# Patient Record
Sex: Male | Born: 1960
Health system: Southern US, Community
[De-identification: ages and names within clinical notes are randomized; demographics above are authoritative.]

## PROBLEM LIST (undated history)

## (undated) DIAGNOSIS — L570 Actinic keratosis: Secondary | ICD-10-CM

## (undated) HISTORY — PX: APPENDECTOMY: SHX54

## (undated) HISTORY — PX: REPLACEMENT TOTAL KNEE: SUR1224

## (undated) HISTORY — PX: ANTERIOR CRUCIATE LIGAMENT REPAIR: SHX115

## (undated) HISTORY — DX: Actinic keratosis: L57.0

## (undated) HISTORY — PX: KNEE SURGERY: SHX244

## (undated) HISTORY — PX: TOTAL HIP ARTHROPLASTY: SHX124

---

## 2006-10-17 DIAGNOSIS — M129 Arthropathy, unspecified: Secondary | ICD-10-CM | POA: Insufficient documentation

## 2007-04-29 ENCOUNTER — Ambulatory Visit: Payer: Self-pay | Admitting: Family Medicine

## 2007-09-11 ENCOUNTER — Ambulatory Visit: Payer: Self-pay

## 2010-01-09 ENCOUNTER — Ambulatory Visit: Payer: Self-pay | Admitting: Unknown Physician Specialty

## 2011-10-17 DIAGNOSIS — Z9889 Other specified postprocedural states: Secondary | ICD-10-CM | POA: Insufficient documentation

## 2013-01-08 ENCOUNTER — Ambulatory Visit: Payer: Self-pay | Admitting: Unknown Physician Specialty

## 2013-01-08 LAB — HM COLONOSCOPY

## 2013-01-12 LAB — PATHOLOGY REPORT

## 2013-12-02 LAB — HEPATIC FUNCTION PANEL
ALK PHOS: 90 U/L (ref 25–125)
ALT: 12 U/L (ref 10–40)
AST: 12 U/L — AB (ref 14–40)
Bilirubin, Total: 1.2 mg/dL

## 2013-12-02 LAB — CBC AND DIFFERENTIAL
HCT: 47 % (ref 41–53)
Hemoglobin: 16.4 g/dL (ref 13.5–17.5)
NEUTROS ABS: 1 /uL
Platelets: 258 10*3/uL (ref 150–399)
WBC: 8.1 10^3/mL

## 2013-12-02 LAB — BASIC METABOLIC PANEL
BUN: 15 mg/dL (ref 4–21)
Creatinine: 1.3 mg/dL (ref 0.6–1.3)
GLUCOSE: 100 mg/dL
Potassium: 4.7 mmol/L (ref 3.4–5.3)
SODIUM: 143 mmol/L (ref 137–147)

## 2013-12-02 LAB — LIPID PANEL
Cholesterol: 184 mg/dL (ref 0–200)
HDL: 32 mg/dL — AB (ref 35–70)
LDL Cholesterol: 108 mg/dL
LDl/HDL Ratio: 3.4
Triglycerides: 220 mg/dL — AB (ref 40–160)

## 2013-12-02 LAB — PSA: PSA: 3.4

## 2013-12-02 LAB — TSH: TSH: 2.09 u[IU]/mL (ref 0.41–5.90)

## 2014-12-06 DIAGNOSIS — M1990 Unspecified osteoarthritis, unspecified site: Secondary | ICD-10-CM | POA: Insufficient documentation

## 2014-12-06 DIAGNOSIS — E7800 Pure hypercholesterolemia, unspecified: Secondary | ICD-10-CM | POA: Insufficient documentation

## 2014-12-08 ENCOUNTER — Ambulatory Visit (INDEPENDENT_AMBULATORY_CARE_PROVIDER_SITE_OTHER): Payer: BLUE CROSS/BLUE SHIELD | Admitting: Family Medicine

## 2014-12-08 ENCOUNTER — Encounter: Payer: Self-pay | Admitting: Family Medicine

## 2014-12-08 VITALS — BP 122/78 | HR 72 | Temp 98.4°F | Resp 16 | Ht 73.0 in | Wt 233.0 lb

## 2014-12-08 DIAGNOSIS — Z0000 Encounter for general adult medical examination without abnormal findings: Secondary | ICD-10-CM

## 2014-12-08 DIAGNOSIS — Z125 Encounter for screening for malignant neoplasm of prostate: Secondary | ICD-10-CM

## 2014-12-08 DIAGNOSIS — D229 Melanocytic nevi, unspecified: Secondary | ICD-10-CM

## 2014-12-08 DIAGNOSIS — Z1211 Encounter for screening for malignant neoplasm of colon: Secondary | ICD-10-CM

## 2014-12-08 LAB — POCT URINALYSIS DIPSTICK
BILIRUBIN UA: NEGATIVE
GLUCOSE UA: NEGATIVE
Ketones, UA: NEGATIVE
Leukocytes, UA: NEGATIVE
Nitrite, UA: NEGATIVE
Protein, UA: NEGATIVE
RBC UA: NEGATIVE
Urobilinogen, UA: NEGATIVE
pH, UA: 6.5

## 2014-12-08 LAB — IFOBT (OCCULT BLOOD): IFOBT: NEGATIVE

## 2014-12-08 NOTE — Progress Notes (Signed)
Patient ID: Brandon Salazar, male   DOB: December 25, 1960, 54 y.o.   MRN: [id number]       Patient: Brandon Salazar, Male    DOB: [birth year], 54 y.o.   MRN: [id number] Visit Date: 12/08/2014  Today's Provider: [name], MD   Chief Complaint  Patient presents with  . Annual Exam   Subjective:    Annual physical exam Brandon Salazar is a 54 y.o. male who presents today for health maintenance and complete physical. He feels fairly well. He reports exercising walking about 1.5 miles a day. He reports he is sleeping well. Patient is married father of 4. 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 Marriage is good, all children are doing well, he likes his job. ----------------------------------------------------------------- Colonoscopy- 01/08/13 Flu vaccine 09/2014   Review of Systems  Constitutional: Negative.   HENT: Negative.   Eyes: Negative.   Respiratory: Negative.   Cardiovascular: Negative.   Gastrointestinal: Negative.   Endocrine: Negative.   Genitourinary: Negative.   Musculoskeletal: Negative.   Skin: Negative.   Allergic/Immunologic: Negative.   Neurological: Negative.   Hematological: Negative.   Psychiatric/Behavioral: Negative.     Social History He  reports that he has never smoked. He does not have any smokeless tobacco history on file. He reports that he does not drink alcohol or use illicit drugs. Social History   Social History  . Marital Status: Married    Spouse Name: N/A  . Number of Children: N/A  . Years of Education: N/A   Social History Main Topics  . Smoking status: Never Smoker   . Smokeless tobacco: None  . Alcohol Use: No  . Drug Use: No  . Sexual Activity: Not Asked   Other Topics Concern  . None   Social History Narrative    Patient Active Problem List   Diagnosis Date Noted  . Hypercholesteremia 12/06/2014  . Arthritis, degenerative 12/06/2014  . History of knee surgery 10/17/2011  . History of repair of hip joint 10/17/2011   . Arthropathia 10/17/2006    Past Surgical History  Procedure Laterality Date  . Appendectomy    . Anterior cruciate ligament repair      Family History  Family Status  Relation Status Death Age  . Mother Alive   . Father Alive   . Sister Alive   . Brother Alive   . Sister Alive   . Sister Alive    His family history includes Arthritis in his father; Heart disease in his maternal grandfather; Stroke in his mother.    Allergies  Allergen Reactions  . Penicillins     Previous Medications   No medications on file    Patient Care Team: Jerrol Banana., MD as PCP - General (Family Medicine)     Objective:   Vitals: BP 122/78 mmHg  Pulse 72  Temp(Src) 98.4 F (36.9 C) (Oral)  Resp 16  Ht 6\' 1"  (1.854 m)  Wt 233 lb (105.688 kg)  BMI 30.75 kg/m2   Physical Exam  Constitutional: He is oriented to person, place, and time. He appears well-developed and well-nourished.  HENT:  Head: Normocephalic and atraumatic.  Right Ear: External ear normal.  Left Ear: External ear normal.  Nose: Nose normal.  Mouth/Throat: Oropharynx is clear and moist.  Eyes: Conjunctivae and EOM are normal. Pupils are equal, round, and reactive to light.  Neck: Normal range of motion. Neck supple.  Cardiovascular: Normal rate, regular rhythm, normal heart sounds and intact distal pulses.  Pulmonary/Chest: Effort normal and breath sounds normal.  Abdominal: Soft. Bowel sounds are normal.  Genitourinary: Rectum normal, prostate normal and penis normal.  Musculoskeletal: Normal range of motion.  Neurological: He is alert and oriented to person, place, and time. He has normal reflexes.  Skin: Skin is warm and dry.  Psychiatric: He has a normal mood and affect. His behavior is normal. Judgment and thought content normal.     Depression Screen PHQ 2/9 Scores 12/08/2014  PHQ - 2 Score 0      Assessment & Plan:     Routine Health Maintenance and Physical Exam  Exercise  Activities and Dietary recommendations Goals    None      Immunization History  Administered Date(s) Administered  . Tdap 10/03/2005    Health Maintenance  Topic Date Due  . Hepatitis C Screening  1960/05/10  . HIV Screening  04/02/1973  . COLONOSCOPY  04/02/2008  . INFLUENZA VACCINE  12/08/2015 (Originally 08/23/2014)  . TETANUS/TDAP  10/04/2015      Discussed health benefits of physical activity, and encouraged him to engage in regular exercise appropriate for his age and condition.    --------------------------------------------------------------------

## 2014-12-09 LAB — CBC WITH DIFFERENTIAL/PLATELET
BASOS ABS: 0 10*3/uL (ref 0.0–0.2)
Basos: 1 %
EOS (ABSOLUTE): 0.1 10*3/uL (ref 0.0–0.4)
EOS: 2 %
Hematocrit: 44.1 % (ref 37.5–51.0)
Hemoglobin: 15.8 g/dL (ref 12.6–17.7)
Immature Grans (Abs): 0 10*3/uL (ref 0.0–0.1)
Immature Granulocytes: 0 %
LYMPHS ABS: 1.7 10*3/uL (ref 0.7–3.1)
Lymphs: 28 %
MCH: 30.8 pg (ref 26.6–33.0)
MCHC: 35.8 g/dL — AB (ref 31.5–35.7)
MCV: 86 fL (ref 79–97)
MONOS ABS: 0.5 10*3/uL (ref 0.1–0.9)
Monocytes: 9 %
Neutrophils Absolute: 3.6 10*3/uL (ref 1.4–7.0)
Neutrophils: 60 %
PLATELETS: 235 10*3/uL (ref 150–379)
RBC: 5.13 x10E6/uL (ref 4.14–5.80)
RDW: 13.8 % (ref 12.3–15.4)
WBC: 5.9 10*3/uL (ref 3.4–10.8)

## 2014-12-09 LAB — COMPREHENSIVE METABOLIC PANEL
ALBUMIN: 4.4 g/dL (ref 3.5–5.5)
ALT: 15 IU/L (ref 0–44)
AST: 16 IU/L (ref 0–40)
Albumin/Globulin Ratio: 2 (ref 1.1–2.5)
Alkaline Phosphatase: 87 IU/L (ref 39–117)
BUN / CREAT RATIO: 10 (ref 9–20)
BUN: 12 mg/dL (ref 6–24)
Bilirubin Total: 1.1 mg/dL (ref 0.0–1.2)
CALCIUM: 9.2 mg/dL (ref 8.7–10.2)
CO2: 26 mmol/L (ref 18–29)
CREATININE: 1.18 mg/dL (ref 0.76–1.27)
Chloride: 101 mmol/L (ref 97–106)
GFR, EST AFRICAN AMERICAN: 79 mL/min/{1.73_m2} (ref >59)
GFR, EST NON AFRICAN AMERICAN: 69 mL/min/{1.73_m2} (ref >59)
GLOBULIN, TOTAL: 2.2 g/dL (ref 1.5–4.5)
GLUCOSE: 95 mg/dL (ref 65–99)
Potassium: 4.3 mmol/L (ref 3.5–5.2)
SODIUM: 143 mmol/L (ref 136–144)
TOTAL PROTEIN: 6.6 g/dL (ref 6.0–8.5)

## 2014-12-09 LAB — LIPID PANEL WITH LDL/HDL RATIO
CHOLESTEROL TOTAL: 169 mg/dL (ref 100–199)
HDL: 28 mg/dL — ABNORMAL LOW (ref >39)
LDL Calculated: 75 mg/dL (ref 0–99)
LDl/HDL Ratio: 2.7 ratio units (ref 0.0–3.6)
Triglycerides: 328 mg/dL — ABNORMAL HIGH (ref 0–149)
VLDL Cholesterol Cal: 66 mg/dL — ABNORMAL HIGH (ref 5–40)

## 2014-12-09 LAB — TSH: TSH: 2.12 u[IU]/mL (ref 0.450–4.500)

## 2014-12-09 LAB — PSA: Prostate Specific Ag, Serum: 3.4 ng/mL (ref 0.0–4.0)

## 2014-12-09 LAB — COMMENT

## 2014-12-14 NOTE — Progress Notes (Signed)
Advised  ED 

## 2015-06-29 DIAGNOSIS — Z96642 Presence of left artificial hip joint: Secondary | ICD-10-CM | POA: Diagnosis not present

## 2015-06-29 DIAGNOSIS — Z09 Encounter for follow-up examination after completed treatment for conditions other than malignant neoplasm: Secondary | ICD-10-CM | POA: Diagnosis not present

## 2015-06-29 DIAGNOSIS — Z96641 Presence of right artificial hip joint: Secondary | ICD-10-CM | POA: Diagnosis not present

## 2015-06-29 DIAGNOSIS — Z96659 Presence of unspecified artificial knee joint: Secondary | ICD-10-CM | POA: Diagnosis not present

## 2015-12-21 ENCOUNTER — Encounter: Payer: Self-pay | Admitting: Family Medicine

## 2015-12-21 ENCOUNTER — Ambulatory Visit (INDEPENDENT_AMBULATORY_CARE_PROVIDER_SITE_OTHER): Payer: BLUE CROSS/BLUE SHIELD | Admitting: Family Medicine

## 2015-12-21 VITALS — BP 116/82 | HR 64 | Temp 98.3°F | Resp 14 | Ht 73.5 in | Wt 236.0 lb

## 2015-12-21 DIAGNOSIS — E7800 Pure hypercholesterolemia, unspecified: Secondary | ICD-10-CM

## 2015-12-21 DIAGNOSIS — Z23 Encounter for immunization: Secondary | ICD-10-CM | POA: Diagnosis not present

## 2015-12-21 DIAGNOSIS — Z1211 Encounter for screening for malignant neoplasm of colon: Secondary | ICD-10-CM

## 2015-12-21 DIAGNOSIS — M1990 Unspecified osteoarthritis, unspecified site: Secondary | ICD-10-CM | POA: Diagnosis not present

## 2015-12-21 DIAGNOSIS — Z0000 Encounter for general adult medical examination without abnormal findings: Secondary | ICD-10-CM

## 2015-12-21 DIAGNOSIS — Z125 Encounter for screening for malignant neoplasm of prostate: Secondary | ICD-10-CM | POA: Diagnosis not present

## 2015-12-21 LAB — POCT URINALYSIS DIPSTICK
BILIRUBIN UA: NEGATIVE
GLUCOSE UA: NEGATIVE
Ketones, UA: NEGATIVE
Leukocytes, UA: NEGATIVE
NITRITE UA: NEGATIVE
Protein, UA: NEGATIVE
RBC UA: NEGATIVE
Urobilinogen, UA: 0.2
pH, UA: 6

## 2015-12-21 LAB — IFOBT (OCCULT BLOOD): IFOBT: NEGATIVE

## 2015-12-21 NOTE — Progress Notes (Signed)
Patient: Brandon Salazar, Male    DOB: [birth year], 55 y.o.   MRN: [id number] Visit Date: 12/21/2015  Today's Provider: [name], MD   Chief Complaint  Patient presents with  . Annual Exam   Subjective:    Annual physical exam Brandon Salazar is a 55 y.o. male who presents today for health maintenance and complete physical. He feels well. He reports exercising none. He reports he is sleeping fairly well.  ----------------------------------------------------------------- Colonoscopy: 01/08/2013- 1 polyp internal hemorrhoids Tetanus given today Flu: 10/04/2015  Review of Systems  Constitutional: Negative.   HENT: Negative.   Eyes: Negative.   Respiratory: Negative.   Cardiovascular: Negative.   Gastrointestinal: Negative.   Endocrine: Negative.   Genitourinary: Negative.   Musculoskeletal: Negative.   Skin: Negative.   Allergic/Immunologic: Negative.   Neurological: Negative.   Hematological: Negative.   Psychiatric/Behavioral: Negative.     Social History      He  reports that he has never smoked. He does not have any smokeless tobacco history on file. He reports that he does not drink alcohol or use drugs.       Social History   Social History  . Marital status: Married    Spouse name: N/A  . Number of children: N/A  . Years of education: N/A   Social History Main Topics  . Smoking status: Never Smoker  . Smokeless tobacco: None  . Alcohol use No  . Drug use: No  . Sexual activity: Not Asked   Other Topics Concern  . None   Social History Narrative  . None    No past medical history on file.   Patient Active Problem List   Diagnosis Date Noted  . Hypercholesteremia 12/06/2014  . Arthritis, degenerative 12/06/2014  . History of knee surgery 10/17/2011  . History of repair of hip joint 10/17/2011  . Arthropathia 10/17/2006    Past Surgical History:  Procedure Laterality Date  . ANTERIOR CRUCIATE LIGAMENT REPAIR    . APPENDECTOMY       Family History        Family Status  Relation Status  . Mother Alive  . Father Alive  . Sister Alive  . Brother Alive  . Sister Alive  . Sister Alive        His family history includes Arthritis in his father; Heart disease in his maternal grandfather; Stroke in his mother.     Allergies  Allergen Reactions  . Penicillins     No current outpatient prescriptions on file.   Patient Care Team: Jerrol Banana., MD as PCP - General (Family Medicine)      Objective:   Vitals: BP 116/82 (BP Location: Right Arm, Patient Position: Sitting, Cuff Size: Normal)   Pulse 64   Temp 98.3 F (36.8 C) (Oral)   Resp 14   Ht 6' 1.5" (1.867 m)   Wt 236 lb (107 kg)   BMI 30.71 kg/m    Physical Exam  Constitutional: He is oriented to person, place, and time. He appears well-developed and well-nourished.  HENT:  Head: Normocephalic and atraumatic.  Right Ear: External ear normal.  Left Ear: External ear normal.  Mouth/Throat: Oropharynx is clear and moist.  Eyes: Conjunctivae and EOM are normal. Pupils are equal, round, and reactive to light.  Neck: Normal range of motion. Neck supple.  Cardiovascular: Normal rate, regular rhythm, normal heart sounds and intact distal pulses.   Pulmonary/Chest: Effort normal and breath  sounds normal.  Abdominal: Soft. Bowel sounds are normal.  Genitourinary: Rectum normal, prostate normal and penis normal.  Musculoskeletal: Normal range of motion.  Neurological: He is alert and oriented to person, place, and time. He has normal reflexes.  Skin: Skin is warm and dry.  Psychiatric: He has a normal mood and affect. His behavior is normal. Judgment and thought content normal.     Depression Screen PHQ 2/9 Scores 12/08/2014  PHQ - 2 Score 0      Assessment & Plan:     Routine Health Maintenance and Physical Exam  Exercise Activities and Dietary recommendations Goals    None      Immunization History  Administered Date(s)  Administered  . Influenza Whole 10/04/2015  . Td 12/21/2015  . Tdap 10/03/2005    Health Maintenance  Topic Date Due  . Hepatitis C Screening  12-05-1960  . HIV Screening  04/02/1973  . COLONOSCOPY  01/09/2023  . TETANUS/TDAP  12/20/2025  . INFLUENZA VACCINE  Completed    Overall health is good. Patient plans to get back in regular routine which she is not been able to do for a year or 2. Discussed health benefits of physical activity, and encouraged him to engage in regular exercise appropriate for his age and condition.    --------------------------------------------------------------------  I have done the exam and reviewed the above chart and it is accurate to the best of my knowledge. Development worker, community has been used in this note in any air is in the dictation or transcription are unintentional.   [name], MD  [facility]

## 2015-12-22 DIAGNOSIS — Z125 Encounter for screening for malignant neoplasm of prostate: Secondary | ICD-10-CM | POA: Diagnosis not present

## 2015-12-22 DIAGNOSIS — Z0000 Encounter for general adult medical examination without abnormal findings: Secondary | ICD-10-CM | POA: Diagnosis not present

## 2015-12-22 DIAGNOSIS — M1990 Unspecified osteoarthritis, unspecified site: Secondary | ICD-10-CM | POA: Diagnosis not present

## 2015-12-22 DIAGNOSIS — E7800 Pure hypercholesterolemia, unspecified: Secondary | ICD-10-CM | POA: Diagnosis not present

## 2015-12-23 LAB — COMPREHENSIVE METABOLIC PANEL
ALBUMIN: 4.4 g/dL (ref 3.5–5.5)
ALK PHOS: 88 IU/L (ref 39–117)
ALT: 19 IU/L (ref 0–44)
AST: 18 IU/L (ref 0–40)
Albumin/Globulin Ratio: 1.8 (ref 1.2–2.2)
BILIRUBIN TOTAL: 1.1 mg/dL (ref 0.0–1.2)
BUN / CREAT RATIO: 13 (ref 9–20)
BUN: 17 mg/dL (ref 6–24)
CHLORIDE: 101 mmol/L (ref 96–106)
CO2: 23 mmol/L (ref 18–29)
Calcium: 9.4 mg/dL (ref 8.7–10.2)
Creatinine, Ser: 1.26 mg/dL (ref 0.76–1.27)
GFR calc Af Amer: 73 mL/min/{1.73_m2} (ref >59)
GFR calc non Af Amer: 63 mL/min/{1.73_m2} (ref >59)
GLUCOSE: 98 mg/dL (ref 65–99)
Globulin, Total: 2.4 g/dL (ref 1.5–4.5)
Potassium: 4 mmol/L (ref 3.5–5.2)
SODIUM: 143 mmol/L (ref 134–144)
Total Protein: 6.8 g/dL (ref 6.0–8.5)

## 2015-12-23 LAB — CBC WITH DIFFERENTIAL
BASOS ABS: 0.1 10*3/uL (ref 0.0–0.2)
Basos: 1 %
EOS (ABSOLUTE): 0.2 10*3/uL (ref 0.0–0.4)
EOS: 3 %
Hematocrit: 45.5 % (ref 37.5–51.0)
Hemoglobin: 16 g/dL (ref 12.6–17.7)
IMMATURE GRANULOCYTES: 0 %
Immature Grans (Abs): 0 10*3/uL (ref 0.0–0.1)
Lymphocytes Absolute: 1.4 10*3/uL (ref 0.7–3.1)
Lymphs: 18 %
MCH: 30.7 pg (ref 26.6–33.0)
MCHC: 35.2 g/dL (ref 31.5–35.7)
MCV: 87 fL (ref 79–97)
MONOS ABS: 0.6 10*3/uL (ref 0.1–0.9)
Monocytes: 8 %
NEUTROS PCT: 70 %
Neutrophils Absolute: 5.5 10*3/uL (ref 1.4–7.0)
RBC: 5.22 x10E6/uL (ref 4.14–5.80)
RDW: 13.6 % (ref 12.3–15.4)
WBC: 7.8 10*3/uL (ref 3.4–10.8)

## 2015-12-23 LAB — LIPID PANEL
CHOL/HDL RATIO: 5.8 ratio — AB (ref 0.0–5.0)
Cholesterol, Total: 192 mg/dL (ref 100–199)
HDL: 33 mg/dL — ABNORMAL LOW (ref >39)
LDL CALC: 87 mg/dL (ref 0–99)
Triglycerides: 362 mg/dL — ABNORMAL HIGH (ref 0–149)
VLDL Cholesterol Cal: 72 mg/dL — ABNORMAL HIGH (ref 5–40)

## 2015-12-23 LAB — TSH: TSH: 2.67 u[IU]/mL (ref 0.450–4.500)

## 2015-12-23 LAB — PSA: PROSTATE SPECIFIC AG, SERUM: 4.2 ng/mL — AB (ref 0.0–4.0)

## 2015-12-26 ENCOUNTER — Encounter: Payer: Self-pay | Admitting: Family Medicine

## 2015-12-26 ENCOUNTER — Telehealth: Payer: Self-pay | Admitting: Family Medicine

## 2015-12-26 NOTE — Telephone Encounter (Signed)
Please review-aa 

## 2015-12-26 NOTE — Telephone Encounter (Signed)
Pt stated he had labs done on 12/22/15 and he would like a call back with results if they are available. Please advise. Thanks TNP

## 2015-12-27 ENCOUNTER — Telehealth: Payer: Self-pay

## 2015-12-27 DIAGNOSIS — R972 Elevated prostate specific antigen [PSA]: Secondary | ICD-10-CM

## 2015-12-27 MED ORDER — SULFAMETHOXAZOLE-TRIMETHOPRIM 800-160 MG PO TABS: 1.0000 | ORAL_TABLET | Freq: Two times a day (BID) | ORAL | 0 refills | Status: DC

## 2015-12-27 NOTE — Telephone Encounter (Signed)
Pt advised. Patient wants to go ahead and treat PSA and repeat level in 1 month. RX sent in and PSA lab slip placed up front.-aa

## 2015-12-27 NOTE — Telephone Encounter (Signed)
-----   Message from Jerrol Banana., MD sent at 12/26/2015  2:51 PM EST ----- Labs okay, triglycerides elevated as expected. Work on diet and exercise as discussed. PSA mildly elevated. Could treat as prostate infection for 2 weeks and recheck PSA in 1 month or go ahead and refer to urology. Either approach is okay. If he wants to treat would use Septra DS twice a day for 2 weeks if he is not allergic to sulfa.

## 2016-03-12 ENCOUNTER — Ambulatory Visit
Admission: RE | Admit: 2016-03-12 | Discharge: 2016-03-12 | Disposition: A | Discharge status: Home / Self Care | Payer: BLUE CROSS/BLUE SHIELD | Source: Ambulatory Visit | Attending: Family Medicine | Admitting: Family Medicine

## 2016-03-12 ENCOUNTER — Other Ambulatory Visit: Payer: Self-pay | Admitting: Family Medicine

## 2016-03-12 DIAGNOSIS — M5412 Radiculopathy, cervical region: Secondary | ICD-10-CM | POA: Insufficient documentation

## 2016-03-12 DIAGNOSIS — G8929 Other chronic pain: Secondary | ICD-10-CM | POA: Diagnosis present

## 2016-03-12 DIAGNOSIS — M25512 Pain in left shoulder: Secondary | ICD-10-CM | POA: Diagnosis not present

## 2016-03-12 DIAGNOSIS — M47892 Other spondylosis, cervical region: Secondary | ICD-10-CM | POA: Diagnosis not present

## 2016-03-12 DIAGNOSIS — M898X1 Other specified disorders of bone, shoulder: Secondary | ICD-10-CM | POA: Diagnosis present

## 2016-03-13 ENCOUNTER — Other Ambulatory Visit: Payer: Self-pay | Admitting: Family Medicine

## 2016-03-13 DIAGNOSIS — G8929 Other chronic pain: Secondary | ICD-10-CM

## 2016-03-13 DIAGNOSIS — M898X1 Other specified disorders of bone, shoulder: Principal | ICD-10-CM

## 2016-03-13 DIAGNOSIS — R29898 Other symptoms and signs involving the musculoskeletal system: Secondary | ICD-10-CM

## 2016-03-20 ENCOUNTER — Ambulatory Visit
Admission: RE | Admit: 2016-03-20 | Discharge: 2016-03-20 | Disposition: A | Discharge status: Home / Self Care | Payer: BLUE CROSS/BLUE SHIELD | Source: Ambulatory Visit | Attending: Family Medicine | Admitting: Family Medicine

## 2016-03-20 DIAGNOSIS — M4802 Spinal stenosis, cervical region: Secondary | ICD-10-CM | POA: Diagnosis not present

## 2016-03-20 DIAGNOSIS — R29898 Other symptoms and signs involving the musculoskeletal system: Secondary | ICD-10-CM

## 2016-03-20 DIAGNOSIS — M898X1 Other specified disorders of bone, shoulder: Principal | ICD-10-CM

## 2016-03-20 DIAGNOSIS — G8929 Other chronic pain: Secondary | ICD-10-CM

## 2016-03-22 DIAGNOSIS — M5412 Radiculopathy, cervical region: Secondary | ICD-10-CM | POA: Diagnosis not present

## 2016-03-26 ENCOUNTER — Ambulatory Visit: Payer: Self-pay | Admitting: Family Medicine

## 2016-03-27 ENCOUNTER — Ambulatory Visit (INDEPENDENT_AMBULATORY_CARE_PROVIDER_SITE_OTHER): Payer: BLUE CROSS/BLUE SHIELD | Admitting: Family Medicine

## 2016-03-27 VITALS — BP 118/76 | HR 62 | Resp 16 | Wt 235.0 lb

## 2016-03-27 DIAGNOSIS — E7800 Pure hypercholesterolemia, unspecified: Secondary | ICD-10-CM | POA: Diagnosis not present

## 2016-03-27 DIAGNOSIS — M5412 Radiculopathy, cervical region: Secondary | ICD-10-CM

## 2016-03-27 DIAGNOSIS — Z6830 Body mass index (BMI) 30.0-30.9, adult: Secondary | ICD-10-CM

## 2016-03-27 DIAGNOSIS — R972 Elevated prostate specific antigen [PSA]: Secondary | ICD-10-CM

## 2016-03-27 MED ORDER — ROSUVASTATIN CALCIUM 10 MG PO TABS: 10.0000 mg | ORAL_TABLET | Freq: Every day | ORAL | 3 refills | Status: DC

## 2016-03-27 NOTE — Progress Notes (Signed)
Brandon Salazar  MRN: [id number] DOB: 1960/10/16  Subjective:  HPI  Patient is here for follow up. He wants to discuss re starting Crestor as a precaution for now. He has not been able to work on habits and his cholesterol levels have gone up. He tolerated Crestor well in the past about 2 years ago and stopped due to levels been so well controlled with his habits also. Lab Results  Component Value Date   CHOL 192 12/22/2015   HDL 33 (L) 12/22/2015   LDLCALC 87 12/22/2015   TRIG 362 (H) 12/22/2015   CHOLHDL 5.8 (H) 12/22/2015   Also this year pt developed a left cervical radiculopathy treated with prednisone by [facility]. He also saw Dr Manuella Ghazi. Pain has resolved but he continues to have mild weakness in left hand--pt is left handed, this is improving,. Last office visit was on 12/21/15 for CPE. Routine labs were done. PSA was 4.2 and this was treated as prostatitis with Septra DS. Patient Active Problem List   Diagnosis Date Noted  . Hypercholesteremia 12/06/2014  . Arthritis, degenerative 12/06/2014  . History of knee surgery 10/17/2011  . History of repair of hip joint 10/17/2011  . Arthropathia 10/17/2006    No past medical history on file.  Social History   Social History  . Marital status: Married    Spouse name: N/A  . Number of children: N/A  . Years of education: N/A   Occupational History  . Not on file.   Social History Main Topics  . Smoking status: Never Smoker  . Smokeless tobacco: Not on file  . Alcohol use No  . Drug use: No  . Sexual activity: Not on file   Other Topics Concern  . Not on file   Social History Narrative  . No narrative on file    Outpatient Encounter Prescriptions as of 03/27/2016  Medication Sig  . [DISCONTINUED] sulfamethoxazole-trimethoprim (BACTRIM DS) 800-160 MG tablet Take 1 tablet by mouth 2 (two) times daily.   No facility-administered encounter medications on file as of 03/27/2016.     Allergies  Allergen  Reactions  . Penicillins     Review of Systems  Constitutional: Negative.        Weakness in left hand  Eyes: Negative.   Respiratory: Negative.   Cardiovascular: Negative.   Musculoskeletal: Positive for joint pain and neck pain.       Had some neck pain but got better from Prednisone.  Neurological: Positive for focal weakness.  Endo/Heme/Allergies: Negative.   Psychiatric/Behavioral: Negative.     Objective:  BP 118/76   Pulse 62   Resp 16   Wt 235 lb (106.6 kg)   BMI 30.58 kg/m   Physical Exam  Constitutional: He is oriented to person, place, and time and well-developed, well-nourished, and in no distress.  HENT:  Head: Normocephalic and atraumatic.  Right Ear: External ear normal.  Left Ear: External ear normal.  Nose: Nose normal.  Eyes: Conjunctivae are normal. Pupils are equal, round, and reactive to light.  Neck: Normal range of motion. Neck supple.  Cardiovascular: Normal rate, regular rhythm, normal heart sounds and intact distal pulses.   No murmur heard. Pulmonary/Chest: Effort normal and breath sounds normal. No respiratory distress. He has no wheezes.  Abdominal: Soft.  Musculoskeletal: He exhibits no edema or tenderness.  Neurological: He is alert and oriented to person, place, and time. Gait normal. GCS score is 15.  Neurosensory exam normal. Mild left hand grip  weakness.  Skin: Skin is warm and dry.  Psychiatric: Mood, memory, affect and judgment normal.    Assessment and Plan :  1. Hypercholesteremia Re start Crestor and check labs in 3 weeks. - Lipid Panel With LDL/HDL Ratio  2. Elevated PSA Re check levels at the end of march. If above 4 will refer to Urology. - PSA  3. BMI 30.0-30.9,adult  4. Cervical radiculopathy - Ambulatory referral to Neurosurgery  HPI, Exam and A&P transcribed under direction and in the presence of Miguel Aschoff, MD. I have done the exam and reviewed the chart and it is accurate to the best of my knowledge.  Development worker, community has been used and  any errors in dictation or transcription are unintentional. Miguel Aschoff M.D. [facility]

## 2016-03-28 DIAGNOSIS — M5412 Radiculopathy, cervical region: Secondary | ICD-10-CM | POA: Insufficient documentation

## 2016-04-17 DIAGNOSIS — M4712 Other spondylosis with myelopathy, cervical region: Secondary | ICD-10-CM | POA: Diagnosis not present

## 2016-04-17 DIAGNOSIS — Z6830 Body mass index (BMI) 30.0-30.9, adult: Secondary | ICD-10-CM | POA: Diagnosis not present

## 2016-04-18 DIAGNOSIS — R972 Elevated prostate specific antigen [PSA]: Secondary | ICD-10-CM | POA: Diagnosis not present

## 2016-04-18 DIAGNOSIS — E7800 Pure hypercholesterolemia, unspecified: Secondary | ICD-10-CM | POA: Diagnosis not present

## 2016-04-19 LAB — LIPID PANEL WITH LDL/HDL RATIO
Cholesterol, Total: 102 mg/dL (ref 100–199)
HDL: 30 mg/dL — AB (ref >39)
LDL Calculated: 44 mg/dL (ref 0–99)
LDl/HDL Ratio: 1.5 ratio units (ref 0.0–3.6)
Triglycerides: 142 mg/dL (ref 0–149)
VLDL Cholesterol Cal: 28 mg/dL (ref 5–40)

## 2016-04-19 LAB — PSA: PROSTATE SPECIFIC AG, SERUM: 4.3 ng/mL — AB (ref 0.0–4.0)

## 2016-04-30 ENCOUNTER — Other Ambulatory Visit: Payer: Self-pay

## 2016-04-30 DIAGNOSIS — R972 Elevated prostate specific antigen [PSA]: Secondary | ICD-10-CM

## 2016-05-31 ENCOUNTER — Ambulatory Visit: Payer: BLUE CROSS/BLUE SHIELD | Admitting: Urology

## 2016-05-31 ENCOUNTER — Encounter: Payer: Self-pay | Admitting: Urology

## 2016-05-31 ENCOUNTER — Telehealth: Payer: Self-pay | Admitting: Family Medicine

## 2016-05-31 VITALS — BP 127/85 | HR 60 | Ht 74.0 in | Wt 239.2 lb

## 2016-05-31 DIAGNOSIS — R972 Elevated prostate specific antigen [PSA]: Secondary | ICD-10-CM

## 2016-05-31 NOTE — Telephone Encounter (Signed)
Pt states he did see the urologist today and is asking for Dr Rosanna Randy to give advise for the next stop.  CB#[phone]/MW

## 2016-05-31 NOTE — Telephone Encounter (Signed)
Patient request call back from Dr. Rosanna Randy to discuss. KW

## 2016-05-31 NOTE — Progress Notes (Signed)
05/31/2016 11:07 AM   Brandon Salazar 21-Jan-1961 [id number]  Referring provider: Jerrol Banana., MD [address] [place], [place] [address]  Chief Complaint  Patient presents with  . Elevated PSA    new patient     HPI: The patient is a 56 year old gentleman presents today for evaluation of an elevated PSA.    PSA History 3/18 - 4.3 11/17 - 4.2 11/16 - 3.4 11/15 - 3.4  He has no complaints at this time. He voids well. He denies nocturia. He has a good stream. He denies hematuria, UTI, nephrolithiasis. He is seen urologist before. He is here solely for his elevated PSA.   PMH: History reviewed. No pertinent past medical history.  Surgical History: Past Surgical History:  Procedure Laterality Date  . ANTERIOR CRUCIATE LIGAMENT REPAIR    . APPENDECTOMY    . KNEE SURGERY    . REPLACEMENT TOTAL KNEE    . TOTAL HIP ARTHROPLASTY      Home Medications:  Allergies as of 05/31/2016      Reactions   Penicillins       Medication List       Accurate as of 05/31/16 11:07 AM. Always use your most recent med list.          rosuvastatin 10 MG tablet Commonly known as:  CRESTOR Take 1 tablet (10 mg total) by mouth daily.       Allergies:  Allergies  Allergen Reactions  . Penicillins     Family History: Family History  Problem Relation Age of Onset  . Stroke Mother   . Arthritis Father   . Heart disease Maternal Grandfather     Social History:  reports that he has never smoked. He has never used smokeless tobacco. He reports that he does not drink alcohol or use drugs.  ROS: UROLOGY Frequent Urination?: No Hard to postpone urination?: No Burning/pain with urination?: No Get up at night to urinate?: No Leakage of urine?: No Urine stream starts and stops?: No Trouble starting stream?: No Do you have to strain to urinate?: No Blood in urine?: No Urinary tract infection?: No Sexually transmitted disease?: No Injury to kidneys or  bladder?: No Painful intercourse?: No Weak stream?: No Erection problems?: No Penile pain?: No  Gastrointestinal Nausea?: No Vomiting?: No Indigestion/heartburn?: No Diarrhea?: No Constipation?: No  Constitutional Fever: No Night sweats?: No Weight loss?: No Fatigue?: No  Skin Skin rash/lesions?: No Itching?: No  Eyes Blurred vision?: No Double vision?: No  Ears/Nose/Throat Sore throat?: No Sinus problems?: No  Hematologic/Lymphatic Swollen glands?: No Easy bruising?: No  Cardiovascular Leg swelling?: No Chest pain?: No  Respiratory Cough?: No Shortness of breath?: No  Endocrine Excessive thirst?: No  Musculoskeletal Back pain?: No Joint pain?: No  Neurological Headaches?: No Dizziness?: No  Psychologic Depression?: No Anxiety?: No  Physical Exam: BP 127/85   Pulse 60   Ht 6\' 2"  (1.88 m)   Wt 239 lb 3.2 oz (108.5 kg)   BMI 30.71 kg/m   Constitutional:  Alert and oriented, No acute distress. HEENT: [place] AT, moist mucus membranes.  Trachea midline, no masses. Cardiovascular: No clubbing, cyanosis, or edema. Respiratory: Normal respiratory effort, no increased work of breathing. GI: Abdomen is soft, nontender, nondistended, no abdominal masses GU: No CVA tenderness. Normal phallus. Testicles descended bilaterally. Benign no masses. DRE: 2+ smooth benign. Skin: No rashes, bruises or suspicious lesions. Lymph: No cervical or inguinal adenopathy. Neurologic: Grossly intact, no focal deficits, moving all  4 extremities. Psychiatric: Normal mood and affect.  Laboratory Data: Lab Results  Component Value Date   WBC 7.8 12/22/2015   HGB 16.4 12/02/2013   HCT 45.5 12/22/2015   MCV 87 12/22/2015   PLT 235 12/08/2014    Lab Results  Component Value Date   CREATININE 1.26 12/22/2015    Lab Results  Component Value Date   PSA 3.4 12/02/2013    No results found for: TESTOSTERONE  No results found for: HGBA1C  Urinalysis    Component  Value Date/Time   BILIRUBINUR negative 12/21/2015 0945   PROTEINUR negative 12/21/2015 0945   UROBILINOGEN 0.2 12/21/2015 0945   NITRITE negative 12/21/2015 0945   LEUKOCYTESUR Negative 12/21/2015 0945    Assessment & Plan:    1. Elevated PSA I had a long discussion with the patient regarding PSA screening to detect prostate cancer. We did discuss that his PSA has been greater than 4 on two separate occasions. I did discuss some of the standard of care especially in a 56 year old would be to perform a prostate biopsy. We discussed the risks, benefits, indications for this. He understands the risks include but are not limited to bleeding and infection. He knows to expect blood in his urine and stool from 48 hours and an semen for up to 6 weeks. He understands the risk of infection naproxen 1% requiring IV antibiotics in the hospital.  At this time, the patient is very hesitant to undergo this procedure. I again strongly encouraged him to give this further thought and undergo a biopsy sooner rather than later. He would like to discuss this with his family members. I have given him instructions for a prostate biopsy. We'll tentatively schedule him for repeat PSA in 6 months since he will not commit to a prostate biopsy at this time. I did strongly encourage him to give this further thought and if he chooses to undergo biopsy sooner, he will call the office and we'll schedule him for a biopsy at that time.    Return in about 6 months (around 12/01/2016) for PSA prior.  Nickie Retort, MD  [facility] [address] [place], [place] [address] [phone]

## 2016-07-26 ENCOUNTER — Telehealth: Payer: Self-pay | Admitting: Family Medicine

## 2016-07-26 NOTE — Telephone Encounter (Signed)
Opened in error

## 2016-08-13 ENCOUNTER — Ambulatory Visit (INDEPENDENT_AMBULATORY_CARE_PROVIDER_SITE_OTHER): Payer: BLUE CROSS/BLUE SHIELD | Admitting: Family Medicine

## 2016-08-13 ENCOUNTER — Encounter: Payer: Self-pay | Admitting: Family Medicine

## 2016-08-13 VITALS — BP 110/68 | HR 62 | Temp 97.8°F | Resp 16 | Wt 238.0 lb

## 2016-08-13 DIAGNOSIS — B372 Candidiasis of skin and nail: Secondary | ICD-10-CM

## 2016-08-13 DIAGNOSIS — R972 Elevated prostate specific antigen [PSA]: Secondary | ICD-10-CM

## 2016-08-13 MED ORDER — NYSTATIN 100000 UNIT/GM EX CREA: 1.0000 "application " | TOPICAL_CREAM | Freq: Two times a day (BID) | CUTANEOUS | 1 refills | Status: DC

## 2016-08-13 NOTE — Progress Notes (Signed)
Subjective:  HPI Pt is here today for a rash in his groin area. He reports that it has been there since mid May. He reports that it itches at times and is "blochy" looking. No raised up. Pt reports that it started out on his arms, and legs. He was given 2 prednisone packs by the team MD at [facility] for this and seems to have gotten better. Then it came back in his groin area. Pt reports that he is about to start spending a lot of time outside in the heat and heat makes it worse.   Pt would also like his PSA rechecked. He was seen  By urology but would like to discuss this with Dr. Rosanna Randy.   Prior to Admission medications   Medication Sig Start Date End Date Taking? Authorizing Provider  rosuvastatin (CRESTOR) 10 MG tablet Take 1 tablet (10 mg total) by mouth daily. 03/27/16   Jerrol Banana., MD    Patient Active Problem List   Diagnosis Date Noted  . Cervical radiculopathy 03/28/2016  . Hypercholesteremia 12/06/2014  . Arthritis, degenerative 12/06/2014  . History of knee surgery 10/17/2011  . History of repair of hip joint 10/17/2011  . Arthropathia 10/17/2006    History reviewed. No pertinent past medical history.  Social History   Social History  . Marital status: Married    Spouse name: N/A  . Number of children: N/A  . Years of education: N/A   Occupational History  . Not on file.   Social History Main Topics  . Smoking status: Never Smoker  . Smokeless tobacco: Never Used  . Alcohol use No  . Drug use: No  . Sexual activity: Not on file   Other Topics Concern  . Not on file   Social History Narrative  . No narrative on file    Allergies  Allergen Reactions  . Penicillins     Review of Systems  Constitutional: Negative.   HENT: Negative.   Eyes: Negative.   Respiratory: Negative.   Cardiovascular: Negative.   Gastrointestinal: Negative.   Genitourinary: Negative.   Musculoskeletal: Negative.   Skin: Positive for itching and rash.    Neurological: Negative.   Endo/Heme/Allergies: Negative.   Psychiatric/Behavioral: Negative.     Immunization History  Administered Date(s) Administered  . Influenza Whole 10/04/2015  . Td 12/21/2015  . Tdap 10/03/2005    Objective:  BP 110/68 (BP Location: Left Arm, Patient Position: Sitting, Cuff Size: Large)   Pulse 62   Temp 97.8 F (36.6 C) (Oral)   Resp 16   Wt 238 lb (108 kg)   BMI 30.56 kg/m   Physical Exam  Constitutional: He is oriented to person, place, and time and well-developed, well-nourished, and in no distress.  HENT:  Head: Normocephalic and atraumatic.  Eyes: Conjunctivae are normal. No scleral icterus.  Cardiovascular: Normal rate, regular rhythm and normal heart sounds.   Pulmonary/Chest: Effort normal.  Neurological: He is alert and oriented to person, place, and time. Gait normal. GCS score is 15.  Skin: Skin is warm and dry.  Groin rash with satellite lesions.  Psychiatric: Mood, memory, affect and judgment normal.    Lab Results  Component Value Date   WBC 7.8 12/22/2015   HGB 16.0 12/22/2015   HCT 45.5 12/22/2015   PLT 235 12/08/2014   GLUCOSE 98 12/22/2015   CHOL 102 04/18/2016   TRIG 142 04/18/2016   HDL 30 (L) 04/18/2016   LDLCALC 44 04/18/2016  TSH 2.670 12/22/2015   PSA 3.4 12/02/2013    CMP     Component Value Date/Time   NA 143 12/22/2015 0834   K 4.0 12/22/2015 0834   CL 101 12/22/2015 0834   CO2 23 12/22/2015 0834   GLUCOSE 98 12/22/2015 0834   BUN 17 12/22/2015 0834   CREATININE 1.26 12/22/2015 0834   CALCIUM 9.4 12/22/2015 0834   PROT 6.8 12/22/2015 0834   ALBUMIN 4.4 12/22/2015 0834   AST 18 12/22/2015 0834   ALT 19 12/22/2015 0834   ALKPHOS 88 12/22/2015 0834   BILITOT 1.1 12/22/2015 0834   GFRNONAA 63 12/22/2015 0834   GFRAA 73 12/22/2015 0834    Assessment and Plan :  1. Yeast dermatitis  - nystatin cream (MYCOSTATIN); Apply 1 application topically 2 (two) times daily.  Dispense: 30 g; Refill: 1  2.  Elevated PSA Discussed at length with pt/wife. Recommend biopsy if PSA still elevated. - PSA  I have done the exam and reviewed the above chart and it is accurate to the best of my knowledge. Development worker, community has been used in this note in any air is in the dictation or transcription are unintentional.  [facility] 08/13/2016 8:55 AM

## 2016-08-14 ENCOUNTER — Telehealth: Payer: Self-pay

## 2016-08-14 LAB — PSA: Prostate Specific Ag, Serum: 3.6 ng/mL (ref 0.0–4.0)

## 2016-08-14 NOTE — Telephone Encounter (Signed)
-----   Message from Jerrol Banana., MD sent at 08/14/2016  9:03 AM EDT ----- PSA better.

## 2016-08-14 NOTE — Telephone Encounter (Signed)
ok 

## 2016-08-14 NOTE — Telephone Encounter (Signed)
Patient advised it is okay to cancel urology referral per Dr. Rosanna Randy.

## 2016-08-14 NOTE — Telephone Encounter (Signed)
Patient advised. He wanted to confirm that it is okay to cancel appointment with urology for labs and possible biopsy? Please advise.   Patient said if calling him back and he don't answer it is okay to leave a message on cell voicemail.

## 2016-11-26 ENCOUNTER — Other Ambulatory Visit: Payer: BLUE CROSS/BLUE SHIELD

## 2016-12-06 ENCOUNTER — Ambulatory Visit: Payer: BLUE CROSS/BLUE SHIELD

## 2016-12-26 ENCOUNTER — Encounter: Payer: BLUE CROSS/BLUE SHIELD | Admitting: Family Medicine

## 2017-01-02 ENCOUNTER — Encounter: Payer: BLUE CROSS/BLUE SHIELD | Admitting: Family Medicine

## 2017-01-07 ENCOUNTER — Encounter: Payer: Self-pay | Admitting: Emergency Medicine

## 2017-01-08 ENCOUNTER — Other Ambulatory Visit: Payer: Self-pay

## 2017-01-08 ENCOUNTER — Encounter: Payer: BLUE CROSS/BLUE SHIELD | Admitting: Family Medicine

## 2017-01-08 ENCOUNTER — Encounter: Payer: Self-pay | Admitting: Family Medicine

## 2017-01-08 ENCOUNTER — Ambulatory Visit (INDEPENDENT_AMBULATORY_CARE_PROVIDER_SITE_OTHER): Payer: BLUE CROSS/BLUE SHIELD | Admitting: Family Medicine

## 2017-01-08 VITALS — BP 110/72 | HR 80 | Temp 98.2°F | Resp 16 | Ht 72.75 in | Wt 236.8 lb

## 2017-01-08 DIAGNOSIS — Z125 Encounter for screening for malignant neoplasm of prostate: Secondary | ICD-10-CM

## 2017-01-08 DIAGNOSIS — Z0000 Encounter for general adult medical examination without abnormal findings: Secondary | ICD-10-CM | POA: Diagnosis not present

## 2017-01-08 DIAGNOSIS — Z6831 Body mass index (BMI) 31.0-31.9, adult: Secondary | ICD-10-CM

## 2017-01-08 DIAGNOSIS — Z1211 Encounter for screening for malignant neoplasm of colon: Secondary | ICD-10-CM | POA: Diagnosis not present

## 2017-01-08 LAB — POCT URINALYSIS DIPSTICK
Bilirubin, UA: NEGATIVE
Blood, UA: NEGATIVE
GLUCOSE UA: NEGATIVE
KETONES UA: NEGATIVE
LEUKOCYTES UA: NEGATIVE
Nitrite, UA: NEGATIVE
Protein, UA: NEGATIVE
SPEC GRAV UA: 1.015 (ref 1.010–1.025)
Urobilinogen, UA: 0.2 E.U./dL
pH, UA: 6 (ref 5.0–8.0)

## 2017-01-08 LAB — IFOBT (OCCULT BLOOD): IFOBT: NEGATIVE

## 2017-01-08 NOTE — Progress Notes (Signed)
Patient: Brandon Salazar, Male    DOB: 06-Sep-1960, 55 y.o.   MRN: [id number] Visit Date: 01/08/2017  Today's Provider: [name], MD   Chief Complaint  Patient presents with  . Annual Exam   Subjective:  Brandon Salazar is a 56 y.o. male who presents today for health maintenance and complete physical. He feels well. He reports exercising walking 3 days a week about 40 to 50 minutes each time. He reports he is sleeping fairly well.  Last colonoscopy was 01/08/2013, internal hemorrhoids. tubular adenoma, repeat in 2019. Dr Vira Agar Immunization History  Administered Date(s) Administered  . Influenza Whole 10/04/2015  . Influenza-Unspecified 10/02/2016  . Td 12/21/2015  . Tdap 10/03/2005   Review of Systems  Constitutional: Negative.   HENT: Negative.   Eyes: Negative.   Respiratory: Negative.   Cardiovascular: Negative.   Gastrointestinal: Negative.   Endocrine: Negative.   Genitourinary: Negative.   Musculoskeletal: Negative.   Skin: Negative.   Allergic/Immunologic: Negative.   Neurological: Negative.   Hematological: Negative.   Psychiatric/Behavioral: Positive for sleep disturbance.    Social History   Socioeconomic History  . Marital status: Married    Spouse name: Not on file  . Number of children: Not on file  . Years of education: Not on file  . Highest education level: Not on file  Social Needs  . Financial resource strain: Not on file  . Food insecurity - worry: Not on file  . Food insecurity - inability: Not on file  . Transportation needs - medical: Not on file  . Transportation needs - non-medical: Not on file  Occupational History  . Not on file  Tobacco Use  . Smoking status: Never Smoker  . Smokeless tobacco: Never Used  Substance and Sexual Activity  . Alcohol use: No  . Drug use: No  . Sexual activity: Yes    Birth control/protection: None  Other Topics Concern  . Not on file  Social History Narrative  . Not on file    Patient  Active Problem List   Diagnosis Date Noted  . Cervical radiculopathy 03/28/2016  . Hypercholesteremia 12/06/2014  . Arthritis, degenerative 12/06/2014  . History of knee surgery 10/17/2011  . History of repair of hip joint 10/17/2011  . Arthropathia 10/17/2006    Past Surgical History:  Procedure Laterality Date  . ANTERIOR CRUCIATE LIGAMENT REPAIR    . APPENDECTOMY    . KNEE SURGERY    . REPLACEMENT TOTAL KNEE    . TOTAL HIP ARTHROPLASTY      His family history includes Arthritis in his father; Heart disease in his maternal grandfather; Stroke in his mother.     Outpatient Encounter Medications as of 01/08/2017  Medication Sig  . rosuvastatin (CRESTOR) 10 MG tablet Take 1 tablet (10 mg total) by mouth daily.  . [DISCONTINUED] nystatin cream (MYCOSTATIN) Apply 1 application topically 2 (two) times daily.   No facility-administered encounter medications on file as of 01/08/2017.     Patient Care Team: Jerrol Banana., MD as PCP - General (Family Medicine)      Objective:   Vitals:  Vitals:   01/08/17 0846  BP: 110/72  Pulse: 80  Resp: 16  Temp: 98.2 F (36.8 C)  Weight: 236 lb 12.8 oz (107.4 kg)  Height: 6' 0.75" (1.848 m)    Physical Exam  Constitutional: He is oriented to person, place, and time. He appears well-developed and well-nourished.  HENT:  Head: Normocephalic and atraumatic.  Right  Ear: External ear normal.  Left Ear: External ear normal.  Mouth/Throat: Oropharynx is clear and moist.  Eyes: Conjunctivae are normal. Pupils are equal, round, and reactive to light. Right eye exhibits no discharge. Left eye exhibits no discharge.  Neck: Normal range of motion. Neck supple.  Cardiovascular: Normal rate, regular rhythm, normal heart sounds and intact distal pulses.  Pulmonary/Chest: Effort normal and breath sounds normal.  Abdominal: Soft.  Neurological: He is alert and oriented to person, place, and time.  Skin: Skin is warm and dry.   Psychiatric: He has a normal mood and affect. His behavior is normal. Judgment and thought content normal.     Depression Screen PHQ 2/9 Scores 01/08/2017 03/27/2016 12/08/2014  PHQ - 2 Score 0 0 0  PHQ- 9 Score 1 0 -      Assessment & Plan:   1. Annual physical exam - CBC with Differential/Platelet - COMPLETE METABOLIC PANEL WITH GFR - TSH - POCT Urinalysis Dipstick - Lipid Profile  2. Colon cancer screening  - IFOBT POC (occult bld, rslt in office); Future - IFOBT POC (occult bld, rslt in office) - Ambulatory referral to Gastroenterology-patient wanted to go ahead and get appointment for colonoscopy for December 2019.  3. Prostate cancer screening - PSA  4. BMI 31.0-31.9,adult  HPI, Exam and A&P transcribed by Tiffany Kocher, RMA under direction and in the presence of Miguel Aschoff, MD. I have done the exam and reviewed the chart and it is accurate to the best of my knowledge. Development worker, community has been used and  any errors in dictation or transcription are unintentional. Miguel Aschoff M.D. [facility]

## 2017-01-09 LAB — COMPLETE METABOLIC PANEL WITH GFR
AG Ratio: 2.2 (calc) (ref 1.0–2.5)
ALKALINE PHOSPHATASE (APISO): 70 U/L (ref 40–115)
ALT: 19 U/L (ref 9–46)
AST: 16 U/L (ref 10–35)
Albumin: 4.1 g/dL (ref 3.6–5.1)
BILIRUBIN TOTAL: 1 mg/dL (ref 0.2–1.2)
BUN: 18 mg/dL (ref 7–25)
CHLORIDE: 105 mmol/L (ref 98–110)
CO2: 28 mmol/L (ref 20–32)
CREATININE: 1.14 mg/dL (ref 0.70–1.33)
Calcium: 9.1 mg/dL (ref 8.6–10.3)
GFR, Est African American: 82 mL/min/{1.73_m2} (ref >=60)
GFR, Est Non African American: 70 mL/min/{1.73_m2} (ref >=60)
GLUCOSE: 111 mg/dL — AB (ref 65–99)
Globulin: 1.9 g/dL (calc) (ref 1.9–3.7)
Potassium: 4.1 mmol/L (ref 3.5–5.3)
SODIUM: 140 mmol/L (ref 135–146)
TOTAL PROTEIN: 6 g/dL — AB (ref 6.1–8.1)

## 2017-01-09 LAB — CBC WITH DIFFERENTIAL/PLATELET
BASOS PCT: 0.8 %
Basophils Absolute: 50 cells/uL (ref 0–200)
EOS ABS: 149 {cells}/uL (ref 15–500)
Eosinophils Relative: 2.4 %
HCT: 42.5 % (ref 38.5–50.0)
Hemoglobin: 14.8 g/dL (ref 13.2–17.1)
Lymphs Abs: 1649 cells/uL (ref 850–3900)
MCH: 30.4 pg (ref 27.0–33.0)
MCHC: 34.8 g/dL (ref 32.0–36.0)
MCV: 87.3 fL (ref 80.0–100.0)
MONOS PCT: 10.2 %
MPV: 9.8 fL (ref 7.5–12.5)
NEUTROS PCT: 60 %
Neutro Abs: 3720 cells/uL (ref 1500–7800)
PLATELETS: 220 10*3/uL (ref 140–400)
RBC: 4.87 10*6/uL (ref 4.20–5.80)
RDW: 13.1 % (ref 11.0–15.0)
TOTAL LYMPHOCYTE: 26.6 %
WBC mixed population: 632 cells/uL (ref 200–950)
WBC: 6.2 10*3/uL (ref 3.8–10.8)

## 2017-01-09 LAB — PSA: PSA: 3.2 ng/mL (ref <=4.0)

## 2017-01-09 LAB — LIPID PANEL
CHOL/HDL RATIO: 4.2 (calc) (ref <5.0)
CHOLESTEROL: 133 mg/dL (ref <200)
HDL: 32 mg/dL — ABNORMAL LOW (ref >40)
LDL CHOLESTEROL (CALC): 77 mg/dL
Non-HDL Cholesterol (Calc): 101 mg/dL (calc) (ref <130)
Triglycerides: 140 mg/dL (ref <150)

## 2017-01-09 LAB — TSH: TSH: 2.18 m[IU]/L (ref 0.40–4.50)

## 2017-01-16 ENCOUNTER — Telehealth: Payer: Self-pay

## 2017-01-16 NOTE — Telephone Encounter (Signed)
[facility] [employer] called asking if we received ROI for Dr. Rosanna Randy? Please return call at [phone].

## 2017-01-29 ENCOUNTER — Encounter: Payer: BLUE CROSS/BLUE SHIELD | Admitting: Family Medicine

## 2017-02-01 ENCOUNTER — Encounter: Payer: Self-pay | Admitting: Family Medicine

## 2017-09-27 ENCOUNTER — Telehealth: Payer: Self-pay | Admitting: Family Medicine

## 2017-09-27 NOTE — Telephone Encounter (Signed)
Pt needs a refill on   Generic Crestor 10 mg  [facility]  [address]

## 2017-09-30 MED ORDER — ROSUVASTATIN CALCIUM 10 MG PO TABS: 10.0000 mg | ORAL_TABLET | Freq: Every day | ORAL | 3 refills | Status: DC

## 2018-01-09 ENCOUNTER — Encounter: Payer: Self-pay | Admitting: Family Medicine

## 2018-02-04 ENCOUNTER — Ambulatory Visit: Payer: BLUE CROSS/BLUE SHIELD | Admitting: Family Medicine

## 2018-02-04 ENCOUNTER — Encounter: Payer: Self-pay | Admitting: Family Medicine

## 2018-02-04 VITALS — BP 122/78 | HR 94 | Temp 100.3°F | Resp 16 | Wt 241.8 lb

## 2018-02-04 DIAGNOSIS — J069 Acute upper respiratory infection, unspecified: Secondary | ICD-10-CM | POA: Diagnosis not present

## 2018-02-04 NOTE — Patient Instructions (Signed)
Discussed use of Mucinex D for congestion, Delsym for cough, and Benadryl for postnasal drainage, Call me if not improving over the week or new symptoms.

## 2018-02-04 NOTE — Progress Notes (Signed)
  Subjective:     Patient ID: DECLIN RAJAN, male   DOB: October 12, 1960, 58 y.o.   MRN: [id number] Chief Complaint  Patient presents with  . Cough    Patient comes in office today with concern of cough for less than 24hrs. Patient reports that chest pain is triggered by cough, he has had symptoms of pressure behind his eyes and watery eyes. Patient states last night he did notice some wheezing and states that he had the sweats, he has taken otc Advil for relief.    HPI No body aches.+ flu shot.  Review of Systems     Objective:   Physical Exam Constitutional:      General: He is not in acute distress.    Appearance: He is not ill-appearing.  Neurological:     Mental Status: He is alert.   Ears: T.M's intact without inflammation Throat: no tonsillar enlargement or exudate.Mild posterior pharyngeal erythema Neck: no cervical adenopathy Lungs: clear     Assessment:    1. URI, acute     Plan:    Discussed use of Mucinex D for congestion, Delsym for cough, and Benadryl for postnasal drainage. To call if symptoms not improving over the week.

## 2018-02-06 ENCOUNTER — Telehealth: Payer: Self-pay | Admitting: Family Medicine

## 2018-02-06 NOTE — Telephone Encounter (Signed)
Pt's wife calling back to let Brandon Salazar know he's now having severe diarrhea and thick brown mucus with a pink tint is being coughed up.  Asking what is the next step to treating him. She was told to call back if not better from visit.  Please call wife back asap.   Thanks, [employer]

## 2018-02-06 NOTE — Telephone Encounter (Signed)
Reports low grade fevers, loose watery stools, clear sinus congestion, and productive cough with brown sputum. Discussed use of imodium, Gatorade, and continued use of Mucienx. Further phone f/u if not continuing to improve over the next few days.

## 2018-02-06 NOTE — Telephone Encounter (Signed)
Please review

## 2018-02-07 DIAGNOSIS — Z86010 Personal history of colonic polyps: Secondary | ICD-10-CM | POA: Diagnosis not present

## 2018-02-07 DIAGNOSIS — Z96642 Presence of left artificial hip joint: Secondary | ICD-10-CM | POA: Diagnosis not present

## 2018-02-07 DIAGNOSIS — Z96641 Presence of right artificial hip joint: Secondary | ICD-10-CM | POA: Diagnosis not present

## 2018-02-07 DIAGNOSIS — Z96651 Presence of right artificial knee joint: Secondary | ICD-10-CM | POA: Diagnosis not present

## 2018-02-25 ENCOUNTER — Encounter: Payer: Self-pay | Admitting: Family Medicine

## 2018-02-25 ENCOUNTER — Ambulatory Visit (INDEPENDENT_AMBULATORY_CARE_PROVIDER_SITE_OTHER): Payer: BLUE CROSS/BLUE SHIELD | Admitting: Family Medicine

## 2018-02-25 VITALS — BP 118/74 | HR 79 | Temp 98.8°F | Ht 73.0 in | Wt 238.0 lb

## 2018-02-25 DIAGNOSIS — Z0000 Encounter for general adult medical examination without abnormal findings: Secondary | ICD-10-CM

## 2018-02-25 LAB — POCT URINALYSIS DIPSTICK
Bilirubin, UA: NEGATIVE
Glucose, UA: NEGATIVE
Ketones, UA: NEGATIVE
LEUKOCYTES UA: NEGATIVE
NITRITE UA: NEGATIVE
PH UA: 5 (ref 5.0–8.0)
PROTEIN UA: NEGATIVE
RBC UA: NEGATIVE
SPEC GRAV UA: 1.025 (ref 1.010–1.025)
UROBILINOGEN UA: 0.2 U/dL

## 2018-02-25 NOTE — Progress Notes (Signed)
Patient: Brandon Salazar, Male    DOB: [birth year], 58 y.o.   MRN: [id number] Visit Date: 02/25/2018  Today's Provider: [name], MD   Chief Complaint  Patient presents with  . Annual Exam   Subjective:  I, Porsha McClurkin, CMA, am acting as a scribe for [name], MD.   Annual physical exam Brandon Salazar is a 58 y.o. male who presents today for health maintenance and complete physical. He feels fairly well. He reports exercising walking and gym. He reports he is sleeping well.  ----------------------------------------------------------------- Last Colonoscopy: 01/08/2013  Review of Systems  Constitutional: Negative.   HENT: Negative.   Eyes: Negative.   Respiratory: Positive for cough.        Cough is still present from recent URI but much improved.  It is almost resolved.  Cardiovascular: Negative.   Gastrointestinal: Negative.   Endocrine: Negative.   Genitourinary: Negative.   Musculoskeletal: Negative.   Skin: Negative.   Allergic/Immunologic: Negative.   Neurological: Negative.   Hematological: Negative.   Psychiatric/Behavioral: Negative.     Social History He  reports that he has never smoked. He has never used smokeless tobacco. He reports that he does not drink alcohol or use drugs. Social History   Socioeconomic History  . Marital status: Married    Spouse name: Not on file  . Number of children: Not on file  . Years of education: Not on file  . Highest education level: Not on file  Occupational History  . Not on file  Social Needs  . Financial resource strain: Not on file  . Food insecurity:    Worry: Not on file    Inability: Not on file  . Transportation needs:    Medical: Not on file    Non-medical: Not on file  Tobacco Use  . Smoking status: Never Smoker  . Smokeless tobacco: Never Used  Substance and Sexual Activity  . Alcohol use: No  . Drug use: No  . Sexual activity: Yes    Birth control/protection: None    Lifestyle  . Physical activity:    Days per week: Not on file    Minutes per session: Not on file  . Stress: Not on file  Relationships  . Social connections:    Talks on phone: Not on file    Gets together: Not on file    Attends religious service: Not on file    Active member of club or organization: Not on file    Attends meetings of clubs or organizations: Not on file    Relationship status: Not on file  Other Topics Concern  . Not on file  Social History Narrative  . Not on file    Patient Active Problem List   Diagnosis Date Noted  . Hypercholesteremia 12/06/2014  . Arthritis, degenerative 12/06/2014  . History of knee surgery 10/17/2011  . History of repair of hip joint 10/17/2011  . Arthropathia 10/17/2006    Past Surgical History:  Procedure Laterality Date  . ANTERIOR CRUCIATE LIGAMENT REPAIR    . APPENDECTOMY    . KNEE SURGERY    . REPLACEMENT TOTAL KNEE    . TOTAL HIP ARTHROPLASTY      Family History  Family Status  Relation Name Status  . Mother  Alive  . Father  Alive  . Sister  Alive  . Brother  Alive  . Sister  Alive  . Sister  Alive  . MGF  (  Not Specified)   His family history includes Arthritis in his father; Heart disease in his maternal grandfather; Stroke in his mother.     Allergies  Allergen Reactions  . Penicillins     Previous Medications   ROSUVASTATIN (CRESTOR) 10 MG TABLET    Take 1 tablet (10 mg total) by mouth daily.    Patient Care Team: Jerrol Banana., MD as PCP - General (Family Medicine)      Objective:   Vitals: BP 118/74 (BP Location: Left Arm, Patient Position: Sitting, Cuff Size: Large)   Pulse 79   Temp 98.8 F (37.1 C) (Oral)   Ht 6\' 1"  (1.854 m)   Wt 238 lb (108 kg)   SpO2 96%   BMI 31.40 kg/m    Physical Exam Constitutional:      Appearance: Normal appearance. He is well-developed.  HENT:     Head: Normocephalic and atraumatic.     Comments: Left EAC blocked with cerumen. Cleared after  irrigation.    Right Ear: Tympanic membrane, ear canal and external ear normal.     Left Ear: Tympanic membrane, ear canal and external ear normal.  Eyes:     General:        Right eye: No discharge.        Left eye: No discharge.     Conjunctiva/sclera: Conjunctivae normal.     Pupils: Pupils are equal, round, and reactive to light.  Neck:     Musculoskeletal: Normal range of motion and neck supple.  Cardiovascular:     Rate and Rhythm: Normal rate and regular rhythm.     Heart sounds: Normal heart sounds.  Pulmonary:     Effort: Pulmonary effort is normal.     Breath sounds: Normal breath sounds.  Abdominal:     Palpations: Abdomen is soft.  Musculoskeletal:     Right lower leg: No edema.     Left lower leg: No edema.  Skin:    General: Skin is warm and dry.  Neurological:     General: No focal deficit present.     Mental Status: He is alert and oriented to person, place, and time. Mental status is at baseline.  Psychiatric:        Mood and Affect: Mood normal.        Behavior: Behavior normal.        Thought Content: Thought content normal.        Judgment: Judgment normal.      Depression Screen PHQ 2/9 Scores 02/25/2018 01/08/2017 03/27/2016 12/08/2014  PHQ - 2 Score 0 0 0 0  PHQ- 9 Score 0 1 0 -      Assessment & Plan:     Routine Health Maintenance and Physical Exam  Exercise Activities and Dietary recommendations Goals   None     Immunization History  Administered Date(s) Administered  . Influenza Whole 10/04/2015  . Influenza-Unspecified 10/02/2016, 11/01/2017  . Td 12/21/2015  . Tdap 10/03/2005    Health Maintenance  Topic Date Due  . HIV Screening  04/02/1973  . COLONOSCOPY  01/09/2023  . TETANUS/TDAP  12/20/2025  . INFLUENZA VACCINE  Completed  . Hepatitis C Screening  Completed     Discussed health benefits of physical activity, and encouraged him to engage in regular exercise appropriate for his age and condition. RTC 1 year.     -------------------------------------------------------------------- I have done the exam and reviewed the chart and it is accurate to the best of my knowledge.  Development worker, community has been used and  any errors in dictation or transcription are unintentional. Miguel Aschoff M.D. [facility]

## 2018-02-27 DIAGNOSIS — Z0000 Encounter for general adult medical examination without abnormal findings: Secondary | ICD-10-CM | POA: Diagnosis not present

## 2018-02-28 LAB — CBC WITH DIFFERENTIAL/PLATELET
BASOS ABS: 0.1 10*3/uL (ref 0.0–0.2)
Basos: 1 %
EOS (ABSOLUTE): 0.1 10*3/uL (ref 0.0–0.4)
Eos: 2 %
HEMOGLOBIN: 15 g/dL (ref 13.0–17.7)
Hematocrit: 42.7 % (ref 37.5–51.0)
IMMATURE GRANS (ABS): 0 10*3/uL (ref 0.0–0.1)
IMMATURE GRANULOCYTES: 0 %
LYMPHS: 27 %
Lymphocytes Absolute: 1.8 10*3/uL (ref 0.7–3.1)
MCH: 31.1 pg (ref 26.6–33.0)
MCHC: 35.1 g/dL (ref 31.5–35.7)
MCV: 88 fL (ref 79–97)
MONOCYTES: 11 %
Monocytes Absolute: 0.7 10*3/uL (ref 0.1–0.9)
Neutrophils Absolute: 3.9 10*3/uL (ref 1.4–7.0)
Neutrophils: 59 %
Platelets: 248 10*3/uL (ref 150–450)
RBC: 4.83 x10E6/uL (ref 4.14–5.80)
RDW: 13.1 % (ref 11.6–15.4)
WBC: 6.6 10*3/uL (ref 3.4–10.8)

## 2018-02-28 LAB — COMPREHENSIVE METABOLIC PANEL
ALT: 22 IU/L (ref 0–44)
AST: 17 IU/L (ref 0–40)
Albumin/Globulin Ratio: 2.3 — ABNORMAL HIGH (ref 1.2–2.2)
Albumin: 4.5 g/dL (ref 3.8–4.9)
Alkaline Phosphatase: 82 IU/L (ref 39–117)
BUN/Creatinine Ratio: 13 (ref 9–20)
BUN: 14 mg/dL (ref 6–24)
Bilirubin Total: 1.3 mg/dL — ABNORMAL HIGH (ref 0.0–1.2)
CO2: 23 mmol/L (ref 20–29)
CREATININE: 1.1 mg/dL (ref 0.76–1.27)
Calcium: 9.4 mg/dL (ref 8.7–10.2)
Chloride: 102 mmol/L (ref 96–106)
GFR calc non Af Amer: 73 mL/min/{1.73_m2} (ref >59)
GFR, EST AFRICAN AMERICAN: 84 mL/min/{1.73_m2} (ref >59)
GLUCOSE: 88 mg/dL (ref 65–99)
Globulin, Total: 2 g/dL (ref 1.5–4.5)
Potassium: 4.2 mmol/L (ref 3.5–5.2)
Sodium: 141 mmol/L (ref 134–144)
TOTAL PROTEIN: 6.5 g/dL (ref 6.0–8.5)

## 2018-02-28 LAB — LIPID PANEL
CHOLESTEROL TOTAL: 137 mg/dL (ref 100–199)
Chol/HDL Ratio: 4.4 ratio (ref 0.0–5.0)
HDL: 31 mg/dL — AB (ref >39)
LDL CALC: 62 mg/dL (ref 0–99)
Triglycerides: 219 mg/dL — ABNORMAL HIGH (ref 0–149)
VLDL CHOLESTEROL CAL: 44 mg/dL — AB (ref 5–40)

## 2018-02-28 LAB — TSH: TSH: 1.87 u[IU]/mL (ref 0.450–4.500)

## 2018-02-28 LAB — PSA: PROSTATE SPECIFIC AG, SERUM: 7.1 ng/mL — AB (ref 0.0–4.0)

## 2018-03-08 ENCOUNTER — Telehealth: Payer: Self-pay | Admitting: Family Medicine

## 2018-03-08 ENCOUNTER — Other Ambulatory Visit: Payer: Self-pay | Admitting: Family Medicine

## 2018-03-08 MED ORDER — DOXYCYCLINE HYCLATE 100 MG PO TABS: 100.0000 mg | ORAL_TABLET | Freq: Two times a day (BID) | ORAL | 0 refills | Status: DC

## 2018-03-08 NOTE — Telephone Encounter (Signed)
Patient's wife states that the patient spoke with Dr. Rosanna Randy on Friday and he was supposed to send in a prescription for a antibiotic but the pharmacy does not have this.  He uses [facility] [place] [facility].  Please advise.

## 2018-03-08 NOTE — Progress Notes (Signed)
Rx presumed prostatitis for PSA at pt request--repeat PSA 3 montha.

## 2018-03-10 NOTE — Telephone Encounter (Signed)
Med was sent in on 03/08/2018.

## 2018-03-12 ENCOUNTER — Telehealth: Payer: Self-pay | Admitting: *Deleted

## 2018-03-12 DIAGNOSIS — R972 Elevated prostate specific antigen [PSA]: Secondary | ICD-10-CM

## 2018-03-12 NOTE — Telephone Encounter (Signed)
-----   Message from Jerrol Banana., MD sent at 03/11/2018  2:12 PM EST ----- Patient advised.  He wishes to be treated with doxycycline for a week and repeat PSA in 3 months.  He is advised to not wait longer than this.  Would prefer urology referral as his PSA has jumped from 4-7

## 2018-03-12 NOTE — Telephone Encounter (Signed)
Referral in Epic  

## 2018-03-20 DIAGNOSIS — D122 Benign neoplasm of ascending colon: Secondary | ICD-10-CM | POA: Diagnosis not present

## 2018-03-20 DIAGNOSIS — K640 First degree hemorrhoids: Secondary | ICD-10-CM | POA: Diagnosis not present

## 2018-03-20 DIAGNOSIS — K635 Polyp of colon: Secondary | ICD-10-CM | POA: Diagnosis not present

## 2018-03-20 DIAGNOSIS — Z86010 Personal history of colonic polyps: Secondary | ICD-10-CM | POA: Diagnosis not present

## 2018-04-02 ENCOUNTER — Telehealth: Payer: Self-pay | Admitting: Family Medicine

## 2018-04-02 DIAGNOSIS — R972 Elevated prostate specific antigen [PSA]: Secondary | ICD-10-CM

## 2018-04-02 NOTE — Telephone Encounter (Signed)
Patient wants to have his PSA rechecked, as it was  High on last visit.    I think Dr. Darnell Level wanted it rechecked also.

## 2018-04-02 NOTE — Telephone Encounter (Signed)
Yes--elevated PSA.

## 2018-04-02 NOTE — Telephone Encounter (Signed)
Okay to place order? KW

## 2018-04-02 NOTE — Telephone Encounter (Signed)
Ordered and pt advised

## 2018-04-08 DIAGNOSIS — R972 Elevated prostate specific antigen [PSA]: Secondary | ICD-10-CM | POA: Diagnosis not present

## 2018-04-09 ENCOUNTER — Telehealth: Payer: Self-pay | Admitting: *Deleted

## 2018-04-09 ENCOUNTER — Telehealth: Payer: Self-pay | Admitting: Family Medicine

## 2018-04-09 LAB — PSA: Prostate Specific Ag, Serum: 5.4 ng/mL — ABNORMAL HIGH (ref 0.0–4.0)

## 2018-04-09 NOTE — Telephone Encounter (Signed)
Spoke with Magda Paganini and she is more comfortable with situation

## 2018-04-09 NOTE — Telephone Encounter (Signed)
-----   Message from Jerrol Banana., MD sent at 04/09/2018  9:02 AM EDT ----- Improved--down to 5.4--woild repeat 6 months with OV.

## 2018-04-09 NOTE — Telephone Encounter (Signed)
Pt advised of lab results and made appt

## 2018-04-09 NOTE — Telephone Encounter (Signed)
Needing to discuss recent diagnosis and care with [facility] Please call wife Magda Paganini.  Thanks, [employer]

## 2018-04-09 NOTE — Telephone Encounter (Signed)
Patient returned call. KW

## 2018-04-09 NOTE — Telephone Encounter (Signed)
LMOVM for pt to return call 

## 2018-04-17 ENCOUNTER — Ambulatory Visit: Payer: BLUE CROSS/BLUE SHIELD | Admitting: Urology

## 2018-04-18 ENCOUNTER — Telehealth: Payer: Self-pay | Admitting: Family Medicine

## 2018-04-18 NOTE — Telephone Encounter (Signed)
FYI~~ Pt refused referral to [facility].He was referred for R97.20 (ICD-10-CM) - Elevated PSA

## 2018-10-07 ENCOUNTER — Other Ambulatory Visit: Payer: Self-pay | Admitting: Family Medicine

## 2018-10-10 ENCOUNTER — Telehealth: Payer: Self-pay

## 2018-10-10 NOTE — Telephone Encounter (Signed)
LMTCB covid screening

## 2018-10-13 ENCOUNTER — Other Ambulatory Visit: Payer: Self-pay

## 2018-10-13 ENCOUNTER — Ambulatory Visit (INDEPENDENT_AMBULATORY_CARE_PROVIDER_SITE_OTHER): Payer: BC Managed Care – PPO | Admitting: Family Medicine

## 2018-10-13 ENCOUNTER — Encounter: Payer: Self-pay | Admitting: Family Medicine

## 2018-10-13 VITALS — BP 122/68 | HR 60 | Temp 98.6°F | Resp 16 | Ht 73.0 in | Wt 228.0 lb

## 2018-10-13 DIAGNOSIS — E7800 Pure hypercholesterolemia, unspecified: Secondary | ICD-10-CM | POA: Diagnosis not present

## 2018-10-13 DIAGNOSIS — R972 Elevated prostate specific antigen [PSA]: Secondary | ICD-10-CM

## 2018-10-13 DIAGNOSIS — Z125 Encounter for screening for malignant neoplasm of prostate: Secondary | ICD-10-CM | POA: Diagnosis not present

## 2018-10-13 DIAGNOSIS — Z23 Encounter for immunization: Secondary | ICD-10-CM | POA: Diagnosis not present

## 2018-10-13 DIAGNOSIS — N400 Enlarged prostate without lower urinary tract symptoms: Secondary | ICD-10-CM

## 2018-10-13 NOTE — Progress Notes (Signed)
Patient: Brandon Salazar Male    DOB: 1960/11/10   58 y.o.   MRN: [id number] Visit Date: 10/13/2018  Today's Provider: [name], MD   Chief Complaint  Patient presents with  . Hyperlipidemia   Subjective:   HPI  Lipid/Cholesterol, Follow-up:   Last seen for this6 months ago.  Management changes since that visit include no changes. . Last Lipid Panel:    Component Value Date/Time   CHOL 137 02/27/2018 1153   TRIG 219 (H) 02/27/2018 1153   HDL 31 (L) 02/27/2018 1153   CHOLHDL 4.4 02/27/2018 1153   CHOLHDL 4.2 01/08/2017 0938   LDLCALC 62 02/27/2018 1153   LDLCALC 77 01/08/2017 0938    He reports good compliance with treatment. He is not having side effects.  Current symptoms include none and have been stable. Weight trend: stable Prior visit with dietician: no Current diet: well balanced Current exercise: walking   Elevated PSA--pt feels well--he does not want prostate biopsy.  Wt Readings from Last 3 Encounters:  10/13/18 228 lb (103.4 kg)  02/25/18 238 lb (108 kg)  02/04/18 241 lb 12.8 oz (109.7 kg)   Allergies  Allergen Reactions  . Penicillins      Current Outpatient Medications:  .  rosuvastatin (CRESTOR) 10 MG tablet, TAKE 1 TABLET(10 MG) BY MOUTH DAILY, Disp: 90 tablet, Rfl: 0 .  doxycycline (VIBRA-TABS) 100 MG tablet, Take 1 tablet (100 mg total) by mouth 2 (two) times daily. (Patient not taking: Reported on 10/13/2018), Disp: 20 tablet, Rfl: 0  Review of Systems  Constitutional: Negative for activity change and fatigue.  Respiratory: Negative for cough and shortness of breath.   Cardiovascular: Negative for chest pain, palpitations and leg swelling.  Gastrointestinal: Negative.   Genitourinary: Negative.   Musculoskeletal: Negative for arthralgias and myalgias.  Neurological: Negative for dizziness, light-headedness and headaches.  Psychiatric/Behavioral: Negative for agitation, self-injury, sleep disturbance and suicidal ideas.  The patient is not nervous/anxious.     Social History   Tobacco Use  . Smoking status: Never Smoker  . Smokeless tobacco: Never Used  Substance Use Topics  . Alcohol use: No      Objective:   BP 122/68   Pulse 60   Temp 98.6 F (37 C)   Resp 16   Ht 6\' 1"  (1.854 m)   Wt 228 lb (103.4 kg)   SpO2 97%   BMI 30.08 kg/m  Vitals:   10/13/18 0854  BP: 122/68  Pulse: 60  Resp: 16  Temp: 98.6 F (37 C)  SpO2: 97%  Weight: 228 lb (103.4 kg)  Height: 6\' 1"  (1.854 m)  Body mass index is 30.08 kg/m.   Physical Exam Constitutional:      Appearance: Normal appearance. He is well-developed.  HENT:     Head: Normocephalic and atraumatic.     Right Ear: Tympanic membrane, ear canal and external ear normal.     Left Ear: Tympanic membrane, ear canal and external ear normal.  Eyes:     General:        Right eye: No discharge.        Left eye: No discharge.     Conjunctiva/sclera: Conjunctivae normal.     Pupils: Pupils are equal, round, and reactive to light.  Neck:     Musculoskeletal: Normal range of motion and neck supple.  Cardiovascular:     Rate and Rhythm: Normal rate and regular rhythm.     Heart sounds: Normal  heart sounds.  Pulmonary:     Effort: Pulmonary effort is normal.     Breath sounds: Normal breath sounds.  Abdominal:     Palpations: Abdomen is soft.  Genitourinary:    Penis: Normal.      Scrotum/Testes: Normal.     Prostate: Normal.     Rectum: Normal.     Comments: Prostate enlarged without nodule. Musculoskeletal:     Right lower leg: No edema.     Left lower leg: No edema.  Skin:    General: Skin is warm and dry.  Neurological:     General: No focal deficit present.     Mental Status: He is alert and oriented to person, place, and time. Mental status is at baseline.  Psychiatric:        Mood and Affect: Mood normal.        Behavior: Behavior normal.        Thought Content: Thought content normal.        Judgment: Judgment normal.       No results found for any visits on 10/13/18.     Assessment & Plan    1. Hypercholesteremia On crestor. - Lipid panel  2. Flu vaccine need  - Flu Vaccine QUAD 6+ mos PF IM (Fluarix Quad PF)  3. Prostate cancer screening  - PSA  4. Elevated PSA If PSA higher will refer to urology for options in 58yo man.  5. Benign prostatic hyperplasia without lower urinary tract symptoms      [name], MD  [facility]

## 2018-10-14 ENCOUNTER — Telehealth: Payer: Self-pay

## 2018-10-14 DIAGNOSIS — R972 Elevated prostate specific antigen [PSA]: Secondary | ICD-10-CM

## 2018-10-14 LAB — LIPID PANEL
Chol/HDL Ratio: 3.8 ratio (ref 0.0–5.0)
Cholesterol, Total: 121 mg/dL (ref 100–199)
HDL: 32 mg/dL — ABNORMAL LOW (ref >39)
LDL Chol Calc (NIH): 66 mg/dL (ref 0–99)
Triglycerides: 126 mg/dL (ref 0–149)
VLDL Cholesterol Cal: 23 mg/dL (ref 5–40)

## 2018-10-14 LAB — PSA: Prostate Specific Ag, Serum: 7.4 ng/mL — ABNORMAL HIGH (ref 0.0–4.0)

## 2018-10-14 NOTE — Telephone Encounter (Signed)
-----   Message from Jerrol Banana., MD sent at 10/14/2018  1:10 PM EDT ----- PSA higher--would refer to Urology for options.Lipids good.

## 2018-10-14 NOTE — Telephone Encounter (Signed)
LMTCB

## 2018-10-14 NOTE — Telephone Encounter (Signed)
Patient advised. He agrees to proceed with Urology referral.

## 2018-10-15 ENCOUNTER — Ambulatory Visit: Payer: Self-pay

## 2018-11-19 ENCOUNTER — Ambulatory Visit: Payer: BC Managed Care – PPO | Admitting: Urology

## 2018-11-26 ENCOUNTER — Ambulatory Visit: Payer: BC Managed Care – PPO | Admitting: Urology

## 2018-11-26 ENCOUNTER — Encounter: Payer: Self-pay | Admitting: Urology

## 2018-11-26 ENCOUNTER — Other Ambulatory Visit: Payer: Self-pay

## 2018-11-26 VITALS — BP 122/77 | HR 69 | Ht 73.5 in | Wt 226.0 lb

## 2018-11-26 DIAGNOSIS — R972 Elevated prostate specific antigen [PSA]: Secondary | ICD-10-CM | POA: Diagnosis not present

## 2018-11-26 DIAGNOSIS — N400 Enlarged prostate without lower urinary tract symptoms: Secondary | ICD-10-CM

## 2018-11-27 ENCOUNTER — Encounter: Payer: Self-pay | Admitting: Urology

## 2018-11-27 DIAGNOSIS — N400 Enlarged prostate without lower urinary tract symptoms: Secondary | ICD-10-CM | POA: Insufficient documentation

## 2018-11-27 DIAGNOSIS — R972 Elevated prostate specific antigen [PSA]: Secondary | ICD-10-CM | POA: Insufficient documentation

## 2018-11-27 NOTE — Progress Notes (Signed)
11/26/2018 7:31 AM   Brandon Salazar 11-07-1960 [id number]  Referring provider: Jerrol Banana., MD [address] [place],  [place] [address]  Chief Complaint  Patient presents with  . Elevated PSA    HPI: Brandon Salazar is a 58 y.o. male seen in consultation at the request of Dr. Rosanna Randy for evaluation of an elevated PSA.  Current PSA trend: 11/2013 3.4 11/2014 3.4 11/2015 4.2 03/2016  4.3  07/2016  3.6 12/2016 3.2 02/2018  7.1 03/2018  5.4 09/2018  7.4   He saw referred and March 2018 and saw Dr. Pilar Jarvis on 05/31/2016.  Prostate biopsy was discussed however he elected surveillance in lieu of biopsy.  His most recent PSA results are significantly elevated above baseline.  He denies bothersome lower urinary tract symptoms.  No dysuria or gross hematuria.  Denies flank, abdominal, pelvic or scrotal pain.   PMH: No past medical history on file.  Surgical History: Past Surgical History:  Procedure Laterality Date  . ANTERIOR CRUCIATE LIGAMENT REPAIR    . APPENDECTOMY    . KNEE SURGERY    . REPLACEMENT TOTAL KNEE    . TOTAL HIP ARTHROPLASTY      Home Medications:  Allergies as of 11/26/2018      Reactions   Penicillins       Medication List       Accurate as of November 26, 2018 11:59 PM. If you have any questions, ask your nurse or doctor.        STOP taking these medications   doxycycline 100 MG tablet Commonly known as: VIBRA-TABS Stopped by: Abbie Sons, MD     TAKE these medications   rosuvastatin 10 MG tablet Commonly known as: CRESTOR TAKE 1 TABLET(10 MG) BY MOUTH DAILY       Allergies:  Allergies  Allergen Reactions  . Penicillins     Family History: Family History  Problem Relation Age of Onset  . Stroke Mother   . Arthritis Father   . Heart disease Maternal Grandfather     Social History:  reports that he has never smoked. He has never used smokeless tobacco. He reports that he does not drink alcohol or use drugs.  ROS: UROLOGY Frequent Urination?: No Hard to postpone urination?: No Burning/pain with urination?: No Get up at night to urinate?: No Leakage of urine?: No Urine stream starts and stops?: No Trouble starting stream?: No Do you have to strain to urinate?: No Blood in urine?: No Urinary tract infection?: No Sexually transmitted disease?: No Injury to kidneys or bladder?: No Painful intercourse?: No Weak stream?: No Erection problems?: No Penile pain?: No  Gastrointestinal Nausea?: No Vomiting?: No Indigestion/heartburn?: No Diarrhea?: No Constipation?: No  Constitutional Fever: No Night sweats?: No Weight loss?: No Fatigue?: No  Skin Skin rash/lesions?: No Itching?: No  Eyes Blurred vision?: No Double vision?: No  Ears/Nose/Throat Sore throat?: No Sinus problems?: No  Hematologic/Lymphatic Swollen glands?: No Easy bruising?: No  Cardiovascular Leg swelling?: No Chest pain?: No  Respiratory Cough?: No Shortness of breath?: No  Endocrine Excessive thirst?: No  Musculoskeletal Back pain?: No Joint pain?: No  Neurological Headaches?: No Dizziness?: No  Psychologic Depression?: No Anxiety?: No  Physical Exam: BP 122/77   Pulse 69   Ht 6' 1.5" (1.867 m)   Wt 226 lb (102.5 kg)   BMI 29.41 kg/m   Constitutional:  Alert and oriented, No acute distress. HEENT:  AT, moist mucus membranes.  Trachea midline, no  masses. Cardiovascular: No clubbing, cyanosis, or edema. Respiratory: Normal respiratory effort, no increased work of breathing. GU: Prostate 60+ cc, smooth without nodules Skin: No rashes, bruises or suspicious lesions. Neurologic: Grossly intact, no focal deficits, moving all 4 extremities. Psychiatric: Normal mood and affect.   Assessment & Plan:    - Elevated PSA Although PSA is a prostate cancer screening test he was informed that cancer is not the most common cause of an elevated PSA. Other potential causes including BPH and  inflammation were discussed. He was informed that the only way to adequately diagnose prostate cancer would be a transrectal ultrasound and biopsy of the prostate. The procedure was discussed including potential risks of bleeding and infection/sepsis. He was also informed that a negative biopsy does not conclusively rule out the possibility that prostate cancer may be present and that continued monitoring is required. The use of newer adjunctive blood tests including PHI and 4kScore were discussed. The use of multiparametric prostate MRI was also discussed however is not typically used for initial evaluation of an elevated PSA. Continued periodic surveillance was also discussed.  He has initially elected to pursue prostate MRI if covered by insurance.  Will call with results and defer further recommendations at that time.  Greater than 50% of this 25-minute visit was spent counseling the patient.   Abbie Sons, [place] [address] [place], [place] [address] [phone]

## 2018-12-08 ENCOUNTER — Other Ambulatory Visit: Payer: Self-pay | Admitting: Urology

## 2018-12-08 DIAGNOSIS — R972 Elevated prostate specific antigen [PSA]: Secondary | ICD-10-CM

## 2018-12-22 ENCOUNTER — Telehealth: Payer: Self-pay | Admitting: Urology

## 2018-12-22 DIAGNOSIS — R972 Elevated prostate specific antigen [PSA]: Secondary | ICD-10-CM

## 2018-12-22 NOTE — Telephone Encounter (Signed)
I need a new MRI order please this patient had a hip replacement and I was told that he will need to go to [place] to have his MRI done. Since the first one has already been scheduled we can not use that order.   Thanks, Sharyn Lull

## 2018-12-23 ENCOUNTER — Ambulatory Visit: Payer: BLUE CROSS/BLUE SHIELD

## 2018-12-24 ENCOUNTER — Ambulatory Visit: Payer: BC Managed Care – PPO

## 2019-01-05 ENCOUNTER — Other Ambulatory Visit: Payer: Self-pay | Admitting: Family Medicine

## 2019-01-05 MED ORDER — ROSUVASTATIN CALCIUM 10 MG PO TABS
ORAL_TABLET | ORAL | 1 refills | Status: DC
Start: 2019-01-05 — End: 2019-11-11

## 2019-01-05 NOTE — Telephone Encounter (Signed)
[facility] faxed refill request for the following medications:  rosuvastatin (CRESTOR) 10 MG tablet  Please advise.  Thanks, [employer]

## 2019-01-07 ENCOUNTER — Ambulatory Visit (HOSPITAL_COMMUNITY)
Admission: RE | Admit: 2019-01-07 | Discharge: 2019-01-07 | Disposition: A | Discharge status: Home / Self Care | Payer: BC Managed Care – PPO | Source: Ambulatory Visit | Attending: Urology | Admitting: Urology

## 2019-01-07 ENCOUNTER — Other Ambulatory Visit: Payer: Self-pay

## 2019-01-07 DIAGNOSIS — R972 Elevated prostate specific antigen [PSA]: Secondary | ICD-10-CM | POA: Insufficient documentation

## 2019-01-07 LAB — POCT I-STAT CREATININE: Creatinine, Ser: 1.2 mg/dL (ref 0.61–1.24)

## 2019-01-07 MED ORDER — GADOBUTROL 1 MMOL/ML IV SOLN
10.0000 mL | Freq: Once | INTRAVENOUS | Status: AC | PRN
  Administered 2019-01-07: 10 mL via INTRAVENOUS

## 2019-01-13 ENCOUNTER — Telehealth: Payer: Self-pay | Admitting: Urology

## 2019-01-13 NOTE — Telephone Encounter (Signed)
I contacted Brandon Salazar regarding his prostate MRI results.  The exam was degraded secondary to artifact from his bilateral hip prosthesis however no indeterminant or suspicious lesions were noted.  He did have PI-RADS 2 lesions consistent with BPH.  The report was reviewed.  We did discuss the false-negative rate of prostate MRI at 15-20%.  Based on the hip prosthesis artifact and current PSA level I did recommend scheduling a standard biopsy.  He indicated he understood the possible risk prostate cancer however declines to pursue a prostate biopsy at this time.  He was agreeable to a 4K score and will set up a lab visit.  If there is a higher probability of high-grade prostate cancer he may be agreeable to biopsy.

## 2019-01-19 NOTE — Telephone Encounter (Signed)
LMOM for pt to call back to schedule 4k lab appt.

## 2019-01-21 NOTE — Telephone Encounter (Signed)
Patient is coming in tomorrow for his 4K score

## 2019-01-22 ENCOUNTER — Ambulatory Visit (INDEPENDENT_AMBULATORY_CARE_PROVIDER_SITE_OTHER): Payer: BC Managed Care – PPO

## 2019-01-22 ENCOUNTER — Other Ambulatory Visit: Payer: Self-pay

## 2019-01-22 DIAGNOSIS — R972 Elevated prostate specific antigen [PSA]: Secondary | ICD-10-CM | POA: Diagnosis not present

## 2019-01-22 NOTE — Progress Notes (Signed)
4KSCORE  Due to elevated PSA level patient presents today for a 4KSCORE  Blood was drawn from left antecubital fossa using a 25G needle and syringe to deposit blood into a SST tube.  Patient tolerated well, no complications were noted.  Blood collected today was spun and labeled for packaging. Requisition form was filled out and signed by provider and patient.  [employer] was contacted for pick up of specimen.   Preformed by: Gordy Clement, CMA   Additional notes/ Follow up: Will call with results.  If Patient has medicare make sure they are aware, Medicare is only covering the 4K draw if it meets this criteria:  Elevated PSA  DRE no nodules  Pervious Negative Biopsy

## 2019-02-16 ENCOUNTER — Other Ambulatory Visit: Payer: Self-pay | Admitting: Urology

## 2019-02-17 ENCOUNTER — Encounter: Payer: Self-pay | Admitting: Urology

## 2019-02-26 ENCOUNTER — Encounter: Payer: Self-pay | Admitting: Family Medicine

## 2019-02-26 ENCOUNTER — Other Ambulatory Visit: Payer: Self-pay

## 2019-02-26 ENCOUNTER — Ambulatory Visit (INDEPENDENT_AMBULATORY_CARE_PROVIDER_SITE_OTHER): Payer: BC Managed Care – PPO | Admitting: Family Medicine

## 2019-02-26 VITALS — BP 122/82 | HR 69 | Temp 97.1°F | Resp 16 | Ht 74.0 in | Wt 233.0 lb

## 2019-02-26 DIAGNOSIS — Z0000 Encounter for general adult medical examination without abnormal findings: Secondary | ICD-10-CM

## 2019-02-26 DIAGNOSIS — Z1211 Encounter for screening for malignant neoplasm of colon: Secondary | ICD-10-CM

## 2019-02-26 DIAGNOSIS — N410 Acute prostatitis: Secondary | ICD-10-CM | POA: Diagnosis not present

## 2019-02-26 DIAGNOSIS — E7800 Pure hypercholesterolemia, unspecified: Secondary | ICD-10-CM | POA: Diagnosis not present

## 2019-02-26 LAB — IFOBT (OCCULT BLOOD): IFOBT: NEGATIVE

## 2019-02-26 LAB — POCT URINALYSIS DIPSTICK
Appearance: NORMAL
Bilirubin, UA: NEGATIVE
Blood, UA: NEGATIVE
Glucose, UA: NEGATIVE
Ketones, UA: NEGATIVE
Nitrite, UA: NEGATIVE
Odor: NORMAL
Protein, UA: POSITIVE — AB
Spec Grav, UA: >=1.03 — AB (ref 1.010–1.025)
Urobilinogen, UA: 0.2 E.U./dL
pH, UA: 6 (ref 5.0–8.0)

## 2019-02-26 NOTE — Progress Notes (Signed)
Patient: Brandon Salazar, Male    DOB: 1960-03-14, 59 y.o.   MRN: [id number] Visit Date: 02/26/2019  Today's Provider: [name], MD   Chief Complaint  Patient presents with  . Annual Exam   Subjective:     Annual physical exam Brandon Salazar is a 59 y.o. male who presents today for health maintenance and complete physical. He feels well. He reports exercising yes. He reports he is sleeping fairly well.  -----------------------------------------------------------  Colonoscopy: 02/01/2017  Review of Systems  Constitutional: Negative.   HENT: Negative.   Eyes: Negative.   Respiratory: Negative.   Cardiovascular: Negative.   Gastrointestinal: Negative.   Endocrine: Negative.   Genitourinary: Negative.   Musculoskeletal: Negative.   Allergic/Immunologic: Negative.   Neurological: Negative.   Hematological: Negative.   Psychiatric/Behavioral: Negative.     Social History      He  reports that he has never smoked. He has never used smokeless tobacco. He reports that he does not drink alcohol or use drugs.       Social History   Socioeconomic History  . Marital status: Married    Spouse name: Not on file  . Number of children: Not on file  . Years of education: Not on file  . Highest education level: Not on file  Occupational History  . Not on file  Tobacco Use  . Smoking status: Never Smoker  . Smokeless tobacco: Never Used  Substance and Sexual Activity  . Alcohol use: No  . Drug use: No  . Sexual activity: Yes    Birth control/protection: None  Other Topics Concern  . Not on file  Social History Narrative  . Not on file   Social Determinants of Health   Financial Resource Strain:   . Difficulty of Paying Living Expenses: Not on file  Food Insecurity:   . Worried About Charity fundraiser in the Last Year: Not on file  . Ran Out of Food in the Last Year: Not on file  Transportation Needs:   . Lack of Transportation (Medical): Not on file   . Lack of Transportation (Non-Medical): Not on file  Physical Activity:   . Days of Exercise per Week: Not on file  . Minutes of Exercise per Session: Not on file  Stress:   . Feeling of Stress : Not on file  Social Connections:   . Frequency of Communication with Friends and Family: Not on file  . Frequency of Social Gatherings with Friends and Family: Not on file  . Attends Religious Services: Not on file  . Active Member of Clubs or Organizations: Not on file  . Attends Archivist Meetings: Not on file  . Marital Status: Not on file    No past medical history on file.   Patient Active Problem List   Diagnosis Date Noted  . Elevated PSA 11/27/2018  . Benign prostatic hyperplasia without lower urinary tract symptoms 11/27/2018  . Hypercholesteremia 12/06/2014  . Arthritis, degenerative 12/06/2014  . History of knee surgery 10/17/2011  . History of repair of hip joint 10/17/2011  . Arthropathia 10/17/2006    Past Surgical History:  Procedure Laterality Date  . ANTERIOR CRUCIATE LIGAMENT REPAIR    . APPENDECTOMY    . KNEE SURGERY    . REPLACEMENT TOTAL KNEE    . TOTAL HIP ARTHROPLASTY      Family History        Family Status  Relation Name Status  .  Mother  Alive  . Father  Alive  . Sister  Alive  . Brother  Alive  . Sister  Alive  . Sister  Alive  . MGF  (Not Specified)        His family history includes Arthritis in his father; Heart disease in his maternal grandfather; Stroke in his mother.      Allergies  Allergen Reactions  . Penicillins      Current Outpatient Medications:  .  rosuvastatin (CRESTOR) 10 MG tablet, TAKE 1 TABLET(10 MG) BY MOUTH DAILY, Disp: 90 tablet, Rfl: 1   Patient Care Team: Jerrol Banana., MD as PCP - General (Family Medicine)    Objective:    Vitals: BP 122/82 (BP Location: Right Arm, Patient Position: Sitting, Cuff Size: Large)   Pulse 69   Temp (!) 97.1 F (36.2 C) (Other (Comment))   Resp 16   Ht  6\' 2"  (1.88 m)   Wt 233 lb (105.7 kg)   SpO2 96%   BMI 29.92 kg/m    Vitals:   02/26/19 1416  BP: 122/82  Pulse: 69  Resp: 16  Temp: (!) 97.1 F (36.2 C)  TempSrc: Other (Comment)  SpO2: 96%  Weight: 233 lb (105.7 kg)  Height: 6\' 2"  (1.88 m)     Physical Exam Vitals reviewed.  Constitutional:      Appearance: Normal appearance. He is well-developed.  HENT:     Head: Normocephalic and atraumatic.     Right Ear: Tympanic membrane, ear canal and external ear normal.     Left Ear: Tympanic membrane, ear canal and external ear normal.  Eyes:     General:        Right eye: No discharge.        Left eye: No discharge.     Conjunctiva/sclera: Conjunctivae normal.     Pupils: Pupils are equal, round, and reactive to light.  Cardiovascular:     Rate and Rhythm: Normal rate and regular rhythm.     Heart sounds: Normal heart sounds.  Pulmonary:     Effort: Pulmonary effort is normal.     Breath sounds: Normal breath sounds.  Abdominal:     Palpations: Abdomen is soft.  Genitourinary:    Penis: Normal.      Testes: Normal.     Prostate: Normal.     Rectum: Normal.     Comments: Prostate enlarged without nodule. Musculoskeletal:     Cervical back: Normal range of motion and neck supple.     Right lower leg: No edema.     Left lower leg: No edema.  Skin:    General: Skin is warm and dry.  Neurological:     General: No focal deficit present.     Mental Status: He is alert and oriented to person, place, and time. Mental status is at baseline.  Psychiatric:        Mood and Affect: Mood normal.        Behavior: Behavior normal.        Thought Content: Thought content normal.        Judgment: Judgment normal.      Depression Screen PHQ 2/9 Scores 02/26/2019 02/25/2018 01/08/2017 03/27/2016  PHQ - 2 Score 0 0 0 0  PHQ- 9 Score 0 0 1 0       Assessment & Plan:     Routine Health Maintenance and Physical Exam  Exercise Activities and Dietary recommendations Goals    None  Immunization History  Administered Date(s) Administered  . Influenza Whole 10/04/2015  . Influenza,inj,Quad PF,6+ Mos 10/13/2018  . Influenza-Unspecified 10/02/2016, 11/01/2017  . Td 12/21/2015  . Tdap 10/03/2005    Health Maintenance  Topic Date Due  . HIV Screening  04/02/1973  . COLONOSCOPY  01/09/2023  . TETANUS/TDAP  12/20/2025  . INFLUENZA VACCINE  Completed  . Hepatitis C Screening  Completed     Discussed health benefits of physical activity, and encouraged him to engage in regular exercise appropriate for his age and condition.   1. Annual physical exam  - POCT urinalysis dipstick - Lipid panel - TSH - Comprehensive Metabolic Panel (CMET) - CBC w/Diff/Platelet  2. Hypercholesteremia  - POCT urinalysis dipstick - Lipid panel - TSH - Comprehensive Metabolic Panel (CMET) - CBC w/Diff/Platelet  3. Encounter for screening fecal occult blood testing  - IFOBT POC (occult bld, rslt in office); Future - IFOBT POC (occult bld, rslt in office)  4. Acute prostatitis/chronic prostatitis/elevated PSA Followed by urology.  Consider antibiotic for a week or so.  PSA was just done a month ago.  Repeat June - CULTURE, URINE COMPREHENSIVE  --------------------------------------------------------------------   Follow up in June.    I,Margret Moat,acting as a scribe for [name], MD.,have documented all relevant documentation on the behalf of [name], MD,as directed by  [name], MD while in the presence of [name], MD.    [name], MD  [facility]

## 2019-02-27 ENCOUNTER — Telehealth: Payer: Self-pay | Admitting: *Deleted

## 2019-02-27 LAB — CBC WITH DIFFERENTIAL/PLATELET
Basophils Absolute: 0.1 10*3/uL (ref 0.0–0.2)
Basos: 1 %
EOS (ABSOLUTE): 0.1 10*3/uL (ref 0.0–0.4)
Eos: 1 %
Hematocrit: 43.5 % (ref 37.5–51.0)
Hemoglobin: 15.5 g/dL (ref 13.0–17.7)
Immature Grans (Abs): 0 10*3/uL (ref 0.0–0.1)
Immature Granulocytes: 0 %
Lymphocytes Absolute: 1.9 10*3/uL (ref 0.7–3.1)
Lymphs: 25 %
MCH: 30.8 pg (ref 26.6–33.0)
MCHC: 35.6 g/dL (ref 31.5–35.7)
MCV: 87 fL (ref 79–97)
Monocytes Absolute: 0.6 10*3/uL (ref 0.1–0.9)
Monocytes: 8 %
Neutrophils Absolute: 4.9 10*3/uL (ref 1.4–7.0)
Neutrophils: 65 %
Platelets: 236 10*3/uL (ref 150–450)
RBC: 5.03 x10E6/uL (ref 4.14–5.80)
RDW: 12.8 % (ref 11.6–15.4)
WBC: 7.6 10*3/uL (ref 3.4–10.8)

## 2019-02-27 LAB — COMPREHENSIVE METABOLIC PANEL
ALT: 18 IU/L (ref 0–44)
AST: 18 IU/L (ref 0–40)
Albumin/Globulin Ratio: 1.8 (ref 1.2–2.2)
Albumin: 4.3 g/dL (ref 3.8–4.9)
Alkaline Phosphatase: 81 IU/L (ref 39–117)
BUN/Creatinine Ratio: 10 (ref 10–24)
BUN: 12 mg/dL (ref 8–27)
Bilirubin Total: 1.2 mg/dL (ref 0.0–1.2)
CO2: 22 mmol/L (ref 20–29)
Calcium: 9.1 mg/dL (ref 8.6–10.2)
Chloride: 106 mmol/L (ref 96–106)
Creatinine, Ser: 1.16 mg/dL (ref 0.76–1.27)
GFR calc Af Amer: 79 mL/min/{1.73_m2} (ref >59)
GFR calc non Af Amer: 68 mL/min/{1.73_m2} (ref >59)
Globulin, Total: 2.4 g/dL (ref 1.5–4.5)
Glucose: 87 mg/dL (ref 65–99)
Potassium: 4.1 mmol/L (ref 3.5–5.2)
Sodium: 144 mmol/L (ref 134–144)
Total Protein: 6.7 g/dL (ref 6.0–8.5)

## 2019-02-27 LAB — LIPID PANEL
Chol/HDL Ratio: 3.8 ratio (ref 0.0–5.0)
Cholesterol, Total: 139 mg/dL (ref 100–199)
HDL: 37 mg/dL — ABNORMAL LOW (ref >39)
LDL Chol Calc (NIH): 76 mg/dL (ref 0–99)
Triglycerides: 146 mg/dL (ref 0–149)
VLDL Cholesterol Cal: 26 mg/dL (ref 5–40)

## 2019-02-27 LAB — TSH: TSH: 1.57 u[IU]/mL (ref 0.450–4.500)

## 2019-02-27 NOTE — Telephone Encounter (Signed)
-----   Message from Jerrol Banana., MD sent at 02/27/2019  2:37 PM EST ----- Labs in good range.  I looked at urinalysis under the microscope and it was normal.

## 2019-02-27 NOTE — Telephone Encounter (Signed)
LMOVM for pt to return call 

## 2019-03-03 LAB — CULTURE, URINE COMPREHENSIVE

## 2019-03-04 NOTE — Telephone Encounter (Signed)
Pt returning your call. Does not give permission for anyone else to disclose.

## 2019-03-05 NOTE — Telephone Encounter (Signed)
Patient advised on lab results. 

## 2019-03-06 ENCOUNTER — Other Ambulatory Visit: Payer: Self-pay | Admitting: *Deleted

## 2019-03-06 DIAGNOSIS — N411 Chronic prostatitis: Secondary | ICD-10-CM

## 2019-03-06 MED ORDER — CIPROFLOXACIN HCL 500 MG PO TABS: 500.0000 mg | ORAL_TABLET | Freq: Two times a day (BID) | ORAL | 0 refills | Status: DC

## 2019-07-02 ENCOUNTER — Encounter: Payer: Self-pay | Admitting: Family Medicine

## 2019-11-11 ENCOUNTER — Other Ambulatory Visit: Payer: Self-pay | Admitting: Family Medicine

## 2020-01-06 ENCOUNTER — Telehealth: Payer: Self-pay

## 2020-01-06 NOTE — Telephone Encounter (Signed)
LMTCB to schedule a nurse visit only for shingles vaccine.  Copied from [place] [id number]. Topic: General - Other >> Dec 24, 2019  8:37 AM Hinda Lenis D wrote: PT need a shingles shot / please advise

## 2020-02-24 DIAGNOSIS — M25531 Pain in right wrist: Secondary | ICD-10-CM | POA: Diagnosis not present

## 2020-02-29 ENCOUNTER — Other Ambulatory Visit: Payer: Self-pay

## 2020-02-29 ENCOUNTER — Ambulatory Visit (INDEPENDENT_AMBULATORY_CARE_PROVIDER_SITE_OTHER): Payer: BC Managed Care – PPO | Admitting: Family Medicine

## 2020-02-29 ENCOUNTER — Encounter: Payer: Self-pay | Admitting: Family Medicine

## 2020-02-29 VITALS — BP 113/80 | HR 69 | Temp 98.1°F | Resp 16 | Ht 72.0 in | Wt 229.0 lb

## 2020-02-29 DIAGNOSIS — Z125 Encounter for screening for malignant neoplasm of prostate: Secondary | ICD-10-CM

## 2020-02-29 DIAGNOSIS — N400 Enlarged prostate without lower urinary tract symptoms: Secondary | ICD-10-CM | POA: Diagnosis not present

## 2020-02-29 DIAGNOSIS — R972 Elevated prostate specific antigen [PSA]: Secondary | ICD-10-CM

## 2020-02-29 DIAGNOSIS — Z1389 Encounter for screening for other disorder: Secondary | ICD-10-CM | POA: Diagnosis not present

## 2020-02-29 DIAGNOSIS — Z0000 Encounter for general adult medical examination without abnormal findings: Secondary | ICD-10-CM | POA: Diagnosis not present

## 2020-02-29 DIAGNOSIS — E7800 Pure hypercholesterolemia, unspecified: Secondary | ICD-10-CM

## 2020-02-29 DIAGNOSIS — Z1211 Encounter for screening for malignant neoplasm of colon: Secondary | ICD-10-CM | POA: Diagnosis not present

## 2020-02-29 LAB — POCT URINALYSIS DIPSTICK
Bilirubin, UA: NEGATIVE
Blood, UA: NEGATIVE
Glucose, UA: NEGATIVE
Ketones, UA: NEGATIVE
Leukocytes, UA: NEGATIVE
Nitrite, UA: NEGATIVE
Protein, UA: NEGATIVE
Spec Grav, UA: 1.025 (ref 1.010–1.025)
Urobilinogen, UA: 0.2 E.U./dL
pH, UA: 6.5 (ref 5.0–8.0)

## 2020-02-29 LAB — IFOBT (OCCULT BLOOD): IFOBT: NEGATIVE

## 2020-02-29 NOTE — Progress Notes (Signed)
Complete physical exam   Patient: Brandon Salazar   DOB: 12/14/1960   60 y.o. Male  MRN: [id number] Visit Date: 02/29/2020  Today's healthcare provider: [name], MD   Chief Complaint  Patient presents with  . Annual Exam   Subjective    Brandon Salazar is a 60 y.o. male who presents today for a complete physical exam.  He reports consuming a general diet. Home exercise routine includes treadmill. He generally feels well. He reports sleeping well. He does not have additional problems to discuss today.  Elevated PSA/BPH is being followed by urology/Dr. Bernardo Heater. He is scheduled for right thumb and wrist surgery on February 28. HPI    No past medical history on file. Past Surgical History:  Procedure Laterality Date  . ANTERIOR CRUCIATE LIGAMENT REPAIR    . APPENDECTOMY    . KNEE SURGERY    . REPLACEMENT TOTAL KNEE    . TOTAL HIP ARTHROPLASTY     Social History   Socioeconomic History  . Marital status: Married    Spouse name: Not on file  . Number of children: Not on file  . Years of education: Not on file  . Highest education level: Not on file  Occupational History  . Not on file  Tobacco Use  . Smoking status: Never Smoker  . Smokeless tobacco: Never Used  Vaping Use  . Vaping Use: Never used  Substance and Sexual Activity  . Alcohol use: No  . Drug use: No  . Sexual activity: Yes    Birth control/protection: None  Other Topics Concern  . Not on file  Social History Narrative  . Not on file   Social Determinants of Health   Financial Resource Strain: Not on file  Food Insecurity: Not on file  Transportation Needs: Not on file  Physical Activity: Not on file  Stress: Not on file  Social Connections: Not on file  Intimate Partner Violence: Not on file   Family Status  Relation Name Status  . Mother  Alive  . Father  Alive  . Sister  Alive  . Brother  Alive  . Sister  Alive  . Sister  Alive  . MGF  (Not Specified)   Family History   Problem Relation Age of Onset  . Stroke Mother   . Arthritis Father   . Heart disease Maternal Grandfather    Allergies  Allergen Reactions  . Penicillins     Patient Care Team: Jerrol Banana., MD as PCP - General (Family Medicine)   Medications: Outpatient Medications Prior to Visit  Medication Sig  . rosuvastatin (CRESTOR) 10 MG tablet TAKE 1 TABLET(10 MG) BY MOUTH DAILY  . ciprofloxacin (CIPRO) 500 MG tablet Take 1 tablet (500 mg total) by mouth 2 (two) times daily.   No facility-administered medications prior to visit.    Review of Systems  All other systems reviewed and are negative.      Objective    BP 113/80   Pulse 69   Temp 98.1 F (36.7 C)   Resp 16   Ht 6' (1.829 m)   Wt 229 lb (103.9 kg)   BMI 31.06 kg/m  BP Readings from Last 3 Encounters:  02/29/20 113/80  02/26/19 122/82  11/26/18 122/77   Wt Readings from Last 3 Encounters:  02/29/20 229 lb (103.9 kg)  02/26/19 233 lb (105.7 kg)  11/26/18 226 lb (102.5 kg)      Physical Exam Vitals reviewed.  Constitutional:      Appearance: Normal appearance. He is well-developed.  HENT:     Head: Normocephalic and atraumatic.     Right Ear: Tympanic membrane, ear canal and external ear normal.     Left Ear: Tympanic membrane, ear canal and external ear normal.  Eyes:     General:        Right eye: No discharge.        Left eye: No discharge.     Conjunctiva/sclera: Conjunctivae normal.     Pupils: Pupils are equal, round, and reactive to light.  Cardiovascular:     Rate and Rhythm: Normal rate and regular rhythm.     Heart sounds: Normal heart sounds.  Pulmonary:     Effort: Pulmonary effort is normal.     Breath sounds: Normal breath sounds.  Abdominal:     Palpations: Abdomen is soft.  Genitourinary:    Penis: Normal.      Testes: Normal.     Prostate: Normal.     Rectum: Normal.     Comments: Prostate enlarged without nodule.   Musculoskeletal:     Cervical back: Normal  range of motion and neck supple.     Right lower leg: No edema.     Left lower leg: No edema.  Skin:    General: Skin is warm and dry.  Neurological:     General: No focal deficit present.     Mental Status: He is alert and oriented to person, place, and time. Mental status is at baseline.  Psychiatric:        Mood and Affect: Mood normal.        Behavior: Behavior normal.        Thought Content: Thought content normal.        Judgment: Judgment normal.       Last depression screening scores PHQ 2/9 Scores 02/29/2020 02/26/2019 02/25/2018  PHQ - 2 Score 0 0 0  PHQ- 9 Score 0 0 0   Last fall risk screening Fall Risk  02/25/2018  Falls in the past year? 0  Number falls in past yr: 0  Injury with Fall? 0   Last Audit-C alcohol use screening Alcohol Use Disorder Test (AUDIT) 02/29/2020  1. How often do you have a drink containing alcohol? 0  2. How many drinks containing alcohol do you have on a typical day when you are drinking? 0  3. How often do you have six or more drinks on one occasion? 0  AUDIT-C Score 0  Alcohol Brief Interventions/Follow-up AUDIT Score <7 follow-up not indicated   A score of 3 or more in women, and 4 or more in men indicates increased risk for alcohol abuse, EXCEPT if all of the points are from question 1   No results found for any visits on 02/29/20.  Assessment & Plan    Routine Health Maintenance and Physical Exam  Exercise Activities and Dietary recommendations Goals   None     Immunization History  Administered Date(s) Administered  . Influenza Whole 10/04/2015  . Influenza,inj,Quad PF,6+ Mos 10/13/2018  . Influenza-Unspecified 10/02/2016, 11/01/2017, 10/21/2019  . Moderna Sars-Covid-2 Vaccination 03/24/2019, 03/24/2019, 04/21/2019, 11/17/2019  . Td 12/21/2015  . Tdap 10/03/2005    Health Maintenance  Topic Date Due  . HIV Screening  Never done  . COLONOSCOPY (Pts 45-40yrs Insurance coverage will need to be confirmed)  01/09/2023  .  TETANUS/TDAP  12/20/2025  . INFLUENZA VACCINE  Completed  . COVID-19 Vaccine  Completed  . Hepatitis C Screening  Completed    Discussed health benefits of physical activity, and encouraged him to engage in regular exercise appropriate for his age and condition. 1. Annual physical exam  - Lipid panel - TSH - CBC w/Diff/Platelet - Comprehensive Metabolic Panel (CMET)  2. Hypercholesteremia  - Lipid panel - TSH - CBC w/Diff/Platelet - Comprehensive Metabolic Panel (CMET)  3. Elevated PSA By urology.  Recommend referral back to urology if PSA goes up - Lipid panel - TSH - CBC w/Diff/Platelet - Comprehensive Metabolic Panel (CMET)  4. Benign prostatic hyperplasia without lower urinary tract symptoms   5. Prostate cancer screening  - PSA  6. Screening for blood or protein in urine  - POCT urinalysis dipstick  7. Encounter for screening fecal occult blood testing  - IFOBT POC (occult bld, rslt in office); Future - IFOBT POC (occult bld, rslt in office)    No follow-ups on file.     I, [name], MD, have reviewed all documentation for this visit. The documentation on 03/05/20 for the exam, diagnosis, procedures, and orders are all accurate and complete.    Kingstyn Deruiter Cranford Mon, MD  [facility] [phone] (phone) [phone] (fax)  [place]

## 2020-03-01 LAB — CBC WITH DIFFERENTIAL/PLATELET
Basophils Absolute: 0.1 10*3/uL (ref 0.0–0.2)
Basos: 1 %
EOS (ABSOLUTE): 0.2 10*3/uL (ref 0.0–0.4)
Eos: 2 %
Hematocrit: 44.1 % (ref 37.5–51.0)
Hemoglobin: 15.5 g/dL (ref 13.0–17.7)
Immature Grans (Abs): 0 10*3/uL (ref 0.0–0.1)
Immature Granulocytes: 0 %
Lymphocytes Absolute: 1.7 10*3/uL (ref 0.7–3.1)
Lymphs: 25 %
MCH: 31.3 pg (ref 26.6–33.0)
MCHC: 35.1 g/dL (ref 31.5–35.7)
MCV: 89 fL (ref 79–97)
Monocytes Absolute: 0.6 10*3/uL (ref 0.1–0.9)
Monocytes: 8 %
Neutrophils Absolute: 4.4 10*3/uL (ref 1.4–7.0)
Neutrophils: 64 %
Platelets: 209 10*3/uL (ref 150–450)
RBC: 4.96 x10E6/uL (ref 4.14–5.80)
RDW: 12.6 % (ref 11.6–15.4)
WBC: 6.9 10*3/uL (ref 3.4–10.8)

## 2020-03-01 LAB — LIPID PANEL
Chol/HDL Ratio: 4 ratio (ref 0.0–5.0)
Cholesterol, Total: 131 mg/dL (ref 100–199)
HDL: 33 mg/dL — ABNORMAL LOW (ref >39)
LDL Chol Calc (NIH): 67 mg/dL (ref 0–99)
Triglycerides: 185 mg/dL — ABNORMAL HIGH (ref 0–149)
VLDL Cholesterol Cal: 31 mg/dL (ref 5–40)

## 2020-03-01 LAB — COMPREHENSIVE METABOLIC PANEL
ALT: 21 IU/L (ref 0–44)
AST: 25 IU/L (ref 0–40)
Albumin/Globulin Ratio: 2 (ref 1.2–2.2)
Albumin: 4.1 g/dL (ref 3.8–4.8)
Alkaline Phosphatase: 89 IU/L (ref 44–121)
BUN/Creatinine Ratio: 10 (ref 10–24)
BUN: 12 mg/dL (ref 8–27)
Bilirubin Total: 1 mg/dL (ref 0.0–1.2)
CO2: 23 mmol/L (ref 20–29)
Calcium: 8.9 mg/dL (ref 8.6–10.2)
Chloride: 104 mmol/L (ref 96–106)
Creatinine, Ser: 1.18 mg/dL (ref 0.76–1.27)
GFR calc Af Amer: 77 mL/min/{1.73_m2} (ref >59)
GFR calc non Af Amer: 66 mL/min/{1.73_m2} (ref >59)
Globulin, Total: 2.1 g/dL (ref 1.5–4.5)
Glucose: 92 mg/dL (ref 65–99)
Potassium: 4.4 mmol/L (ref 3.5–5.2)
Sodium: 144 mmol/L (ref 134–144)
Total Protein: 6.2 g/dL (ref 6.0–8.5)

## 2020-03-01 LAB — PSA: Prostate Specific Ag, Serum: 8.2 ng/mL — ABNORMAL HIGH (ref 0.0–4.0)

## 2020-03-01 LAB — TSH: TSH: 2.45 u[IU]/mL (ref 0.450–4.500)

## 2020-03-18 DIAGNOSIS — M19031 Primary osteoarthritis, right wrist: Secondary | ICD-10-CM | POA: Diagnosis not present

## 2020-03-23 DIAGNOSIS — M25631 Stiffness of right wrist, not elsewhere classified: Secondary | ICD-10-CM | POA: Diagnosis not present

## 2020-03-23 DIAGNOSIS — Z4789 Encounter for other orthopedic aftercare: Secondary | ICD-10-CM | POA: Diagnosis not present

## 2020-03-31 DIAGNOSIS — Z4789 Encounter for other orthopedic aftercare: Secondary | ICD-10-CM | POA: Diagnosis not present

## 2020-03-31 DIAGNOSIS — M25631 Stiffness of right wrist, not elsewhere classified: Secondary | ICD-10-CM | POA: Diagnosis not present

## 2020-03-31 DIAGNOSIS — M79641 Pain in right hand: Secondary | ICD-10-CM | POA: Diagnosis not present

## 2020-04-14 DIAGNOSIS — M25631 Stiffness of right wrist, not elsewhere classified: Secondary | ICD-10-CM | POA: Diagnosis not present

## 2020-04-14 DIAGNOSIS — M79641 Pain in right hand: Secondary | ICD-10-CM | POA: Diagnosis not present

## 2020-04-14 DIAGNOSIS — Z4789 Encounter for other orthopedic aftercare: Secondary | ICD-10-CM | POA: Diagnosis not present

## 2020-05-02 ENCOUNTER — Encounter: Payer: Self-pay | Admitting: Family Medicine

## 2020-05-02 DIAGNOSIS — M79641 Pain in right hand: Secondary | ICD-10-CM | POA: Diagnosis not present

## 2020-05-02 DIAGNOSIS — M25631 Stiffness of right wrist, not elsewhere classified: Secondary | ICD-10-CM | POA: Diagnosis not present

## 2020-05-02 DIAGNOSIS — Z4789 Encounter for other orthopedic aftercare: Secondary | ICD-10-CM | POA: Diagnosis not present

## 2020-06-08 DIAGNOSIS — M19031 Primary osteoarthritis, right wrist: Secondary | ICD-10-CM | POA: Diagnosis not present

## 2020-07-07 ENCOUNTER — Other Ambulatory Visit: Payer: Self-pay | Admitting: Family Medicine

## 2020-09-20 ENCOUNTER — Encounter: Payer: Self-pay | Admitting: Family Medicine

## 2020-09-20 ENCOUNTER — Other Ambulatory Visit: Payer: Self-pay

## 2020-09-20 ENCOUNTER — Ambulatory Visit: Payer: BC Managed Care – PPO | Admitting: Family Medicine

## 2020-09-20 VITALS — BP 127/85 | HR 67 | Temp 97.9°F | Resp 16 | Ht 72.0 in | Wt 240.0 lb

## 2020-09-20 DIAGNOSIS — Z125 Encounter for screening for malignant neoplasm of prostate: Secondary | ICD-10-CM

## 2020-09-20 DIAGNOSIS — E7800 Pure hypercholesterolemia, unspecified: Secondary | ICD-10-CM | POA: Diagnosis not present

## 2020-09-20 DIAGNOSIS — R972 Elevated prostate specific antigen [PSA]: Secondary | ICD-10-CM

## 2020-09-20 NOTE — Progress Notes (Signed)
I,[name],acting as a scribe for [name], MD.,have documented all relevant documentation on the behalf of [name], MD,as directed by  [name], MD while in the presence of [name], MD.   Established patient visit   Patient: Brandon Salazar   DOB: April 24, 1960   60 y.o. Male  MRN: [id number] Visit Date: 09/20/2020  Today's healthcare provider: [name], MD   Chief Complaint  Patient presents with   Follow-up   Hyperlipidemia   Subjective  -------------------------------------------------------------------------------------------------------------------- HPI  She comes in today for follow-up.  He has had wrist surgery and is slowly recovering.  He is feeling well.  Follow-up of his PSA. Lipid/Cholesterol, follow-up  Last Lipid Panel: Lab Results  Component Value Date   CHOL 131 02/29/2020   LDLCALC 67 02/29/2020   HDL 33 (L) 02/29/2020   TRIG 185 (H) 02/29/2020    He was last seen for this 6 months ago.  Management since that visit includes; on rosuvastatin.  He reports good compliance with treatment. He is not having side effects. none  He is following a Regular diet. Current exercise: none  Last metabolic panel Lab Results  Component Value Date   GLUCOSE 92 02/29/2020   NA 144 02/29/2020   K 4.4 02/29/2020   BUN 12 02/29/2020   CREATININE 1.18 02/29/2020   GFRNONAA 66 02/29/2020   GFRAA 77 02/29/2020   CALCIUM 8.9 02/29/2020   AST 25 02/29/2020   ALT 21 02/29/2020   The 10-year ASCVD risk score Mikey Bussing DC Jr., et al., 2013) is: 9.3%  ---------------------------------------------------------------------------------------------------     Medications: Outpatient Medications Prior to Visit  Medication Sig   rosuvastatin (CRESTOR) 10 MG tablet TAKE 1 TABLET(10 MG) BY MOUTH DAILY   [DISCONTINUED] ciprofloxacin (CIPRO) 500 MG tablet Take 1 tablet (500 mg total) by mouth 2 (two) times daily.   No  facility-administered medications prior to visit.    Review of Systems  Constitutional:  Negative for appetite change, chills and fever.  Respiratory:  Negative for chest tightness, shortness of breath and wheezing.   Cardiovascular:  Negative for chest pain and palpitations.  Gastrointestinal:  Negative for abdominal pain, nausea and vomiting.      Objective  -------------------------------------------------------------------------------------------------------------------- BP 127/85 (BP Location: Right Arm, Patient Position: Sitting, Cuff Size: Large)   Pulse 67   Temp 97.9 F (36.6 C) (Temporal)   Resp 16   Ht 6' (1.829 m)   Wt 240 lb (108.9 kg)   SpO2 98%   BMI 32.55 kg/m  Physical Exam Vitals reviewed.  Constitutional:      Appearance: Normal appearance. He is well-developed.  HENT:     Head: Normocephalic and atraumatic.     Right Ear: Tympanic membrane, ear canal and external ear normal.     Left Ear: Tympanic membrane, ear canal and external ear normal.  Eyes:     General:        Right eye: No discharge.        Left eye: No discharge.     Conjunctiva/sclera: Conjunctivae normal.     Pupils: Pupils are equal, round, and reactive to light.  Cardiovascular:     Rate and Rhythm: Normal rate and regular rhythm.     Heart sounds: Normal heart sounds.  Pulmonary:     Effort: Pulmonary effort is normal.     Breath sounds: Normal breath sounds.  Abdominal:     Palpations: Abdomen is soft.  Musculoskeletal:  Cervical back: Normal range of motion and neck supple.     Right lower leg: No edema.     Left lower leg: No edema.  Skin:    General: Skin is warm and dry.  Neurological:     General: No focal deficit present.     Mental Status: He is alert and oriented to person, place, and time. Mental status is at baseline.  Psychiatric:        Mood and Affect: Mood normal.        Behavior: Behavior normal.        Thought Content: Thought content normal.         Judgment: Judgment normal.      No results found for any visits on 09/20/20.  Assessment & Plan  ---------------------------------------------------------------------------------------------------------------------- 1. Hypercholesteremia On Crestor. - Lipid panel - TSH - CBC with Differential/Platelet - Comprehensive Metabolic Panel (CMET)  2. Prostate cancer screening  - PSA  3. Elevated PSA Will follow if stable.  Refer back to urology if elevated beyond what it has been at 8. - PSA   Return in about 6 months (around 03/21/2021).      I, [name], MD, have reviewed all documentation for this visit. The documentation on 09/24/20 for the exam, diagnosis, procedures, and orders are all accurate and complete.    Brandon President Cranford Mon, MD  [facility] [phone] (phone) [phone] (fax)  [place]

## 2020-09-21 LAB — CBC WITH DIFFERENTIAL/PLATELET
Basophils Absolute: 0.1 10*3/uL (ref 0.0–0.2)
Basos: 1 %
EOS (ABSOLUTE): 0.2 10*3/uL (ref 0.0–0.4)
Eos: 2 %
Hematocrit: 43.3 % (ref 37.5–51.0)
Hemoglobin: 14.9 g/dL (ref 13.0–17.7)
Immature Grans (Abs): 0 10*3/uL (ref 0.0–0.1)
Immature Granulocytes: 0 %
Lymphocytes Absolute: 1.9 10*3/uL (ref 0.7–3.1)
Lymphs: 27 %
MCH: 30.8 pg (ref 26.6–33.0)
MCHC: 34.4 g/dL (ref 31.5–35.7)
MCV: 90 fL (ref 79–97)
Monocytes Absolute: 0.7 10*3/uL (ref 0.1–0.9)
Monocytes: 9 %
Neutrophils Absolute: 4.4 10*3/uL (ref 1.4–7.0)
Neutrophils: 61 %
Platelets: 208 10*3/uL (ref 150–450)
RBC: 4.84 x10E6/uL (ref 4.14–5.80)
RDW: 12.9 % (ref 11.6–15.4)
WBC: 7.2 10*3/uL (ref 3.4–10.8)

## 2020-09-21 LAB — COMPREHENSIVE METABOLIC PANEL
ALT: 18 IU/L (ref 0–44)
AST: 34 IU/L (ref 0–40)
Albumin/Globulin Ratio: 2.3 — ABNORMAL HIGH (ref 1.2–2.2)
Albumin: 4.5 g/dL (ref 3.8–4.8)
Alkaline Phosphatase: 97 IU/L (ref 44–121)
BUN/Creatinine Ratio: 10 (ref 10–24)
BUN: 11 mg/dL (ref 8–27)
Bilirubin Total: 1.2 mg/dL (ref 0.0–1.2)
CO2: 25 mmol/L (ref 20–29)
Calcium: 9.2 mg/dL (ref 8.6–10.2)
Chloride: 103 mmol/L (ref 96–106)
Creatinine, Ser: 1.12 mg/dL (ref 0.76–1.27)
Globulin, Total: 2 g/dL (ref 1.5–4.5)
Glucose: 85 mg/dL (ref 65–99)
Potassium: 4.3 mmol/L (ref 3.5–5.2)
Sodium: 142 mmol/L (ref 134–144)
Total Protein: 6.5 g/dL (ref 6.0–8.5)
eGFR: 74 mL/min/{1.73_m2} (ref >59)

## 2020-09-21 LAB — LIPID PANEL
Chol/HDL Ratio: 3.5 ratio (ref 0.0–5.0)
Cholesterol, Total: 122 mg/dL (ref 100–199)
HDL: 35 mg/dL — ABNORMAL LOW (ref >39)
LDL Chol Calc (NIH): 64 mg/dL (ref 0–99)
Triglycerides: 131 mg/dL (ref 0–149)
VLDL Cholesterol Cal: 23 mg/dL (ref 5–40)

## 2020-09-21 LAB — TSH: TSH: 2.34 u[IU]/mL (ref 0.450–4.500)

## 2020-09-21 LAB — PSA: Prostate Specific Ag, Serum: 9.9 ng/mL — ABNORMAL HIGH (ref 0.0–4.0)

## 2020-09-28 ENCOUNTER — Other Ambulatory Visit: Payer: Self-pay

## 2020-09-28 ENCOUNTER — Ambulatory Visit: Payer: BC Managed Care – PPO | Admitting: Dermatology

## 2020-09-28 DIAGNOSIS — L570 Actinic keratosis: Secondary | ICD-10-CM

## 2020-09-28 DIAGNOSIS — D239 Other benign neoplasm of skin, unspecified: Secondary | ICD-10-CM

## 2020-09-28 DIAGNOSIS — Z1283 Encounter for screening for malignant neoplasm of skin: Secondary | ICD-10-CM | POA: Diagnosis not present

## 2020-09-28 DIAGNOSIS — D485 Neoplasm of uncertain behavior of skin: Secondary | ICD-10-CM

## 2020-09-28 DIAGNOSIS — D2372 Other benign neoplasm of skin of left lower limb, including hip: Secondary | ICD-10-CM

## 2020-09-28 DIAGNOSIS — L918 Other hypertrophic disorders of the skin: Secondary | ICD-10-CM | POA: Diagnosis not present

## 2020-09-28 DIAGNOSIS — L821 Other seborrheic keratosis: Secondary | ICD-10-CM

## 2020-09-28 DIAGNOSIS — D1800 Hemangioma unspecified site: Secondary | ICD-10-CM

## 2020-09-28 DIAGNOSIS — L578 Other skin changes due to chronic exposure to nonionizing radiation: Secondary | ICD-10-CM | POA: Diagnosis not present

## 2020-09-28 DIAGNOSIS — L814 Other melanin hyperpigmentation: Secondary | ICD-10-CM

## 2020-09-28 DIAGNOSIS — L820 Inflamed seborrheic keratosis: Secondary | ICD-10-CM | POA: Diagnosis not present

## 2020-09-28 DIAGNOSIS — D229 Melanocytic nevi, unspecified: Secondary | ICD-10-CM

## 2020-09-28 NOTE — Patient Instructions (Addendum)
Cryotherapy Aftercare  Wash gently with soap and water everyday.   Apply Vaseline and Band-Aid daily until healed.    Wound Care Instructions  Cleanse wound gently with soap and water once a day then pat dry with clean gauze. Apply a thing coat of Petrolatum (petroleum jelly, "Vaseline") over the wound (unless you have an allergy to this). We recommend that you use a new, sterile tube of Vaseline. Do not pick or remove scabs. Do not remove the yellow or white "healing tissue" from the base of the wound.  Cover the wound with fresh, clean, nonstick gauze and secure with paper tape. You may use Band-Aids in place of gauze and tape if the would is small enough, but would recommend trimming much of the tape off as there is often too much. Sometimes Band-Aids can irritate the skin.  You should call the office for your biopsy report after 1 week if you have not already been contacted.  If you experience any problems, such as abnormal amounts of bleeding, swelling, significant bruising, significant pain, or evidence of infection, please call the office immediately.  FOR ADULT SURGERY PATIENTS: If you need something for pain relief you may take 1 extra strength Tylenol (acetaminophen) AND 2 Ibuprofen (200mg each) together every 4 hours as needed for pain. (do not take these if you are allergic to them or if you have a reason you should not take them.) Typically, you may only need pain medication for 1 to 3 days.     If you have any questions or concerns for your doctor, please call our main line at [phone] and press option 4 to reach your doctor's medical assistant. If no one answers, please leave a voicemail as directed and we will return your call as soon as possible. Messages left after 4 pm will be answered the following business day.   You may also send us a message via MyChart. We typically respond to MyChart messages within 1-2 business days.  For prescription refills, please ask your  pharmacy to contact our office. Our fax number is [phone].  If you have an urgent issue when the clinic is closed that cannot wait until the next business day, you can page your doctor at the number below.    Please note that while we do our best to be available for urgent issues outside of office hours, we are not available 24/7.   If you have an urgent issue and are unable to reach us, you may choose to seek medical care at your doctor's office, retail clinic, urgent care center, or emergency room.  If you have a medical emergency, please immediately call 911 or go to the emergency department.  Pager Numbers  - Dr. Kowalski: [phone]  - Dr. Moye: [phone]  - Dr. Stewart: [phone]  In the event of inclement weather, please call our main line at [phone] for an update on the status of any delays or closures.  Dermatology Medication Tips: Please keep the boxes that topical medications come in in order to help keep track of the instructions about where and how to use these. Pharmacies typically print the medication instructions only on the boxes and not directly on the medication tubes.   If your medication is too expensive, please contact our office at [phone] option 4 or send us a message through MyChart.   We are unable to tell what your co-pay for medications will be in advance as this is different depending on your insurance coverage.   However, we may be able to find a substitute medication at lower cost or fill out paperwork to get insurance to cover a needed medication.   If a prior authorization is required to get your medication covered by your insurance company, please allow us 1-2 business days to complete this process.  Drug prices often vary depending on where the prescription is filled and some pharmacies may offer cheaper prices.  The website www.goodrx.com contains coupons for medications through different pharmacies. The prices here do not  account for what the cost may be with help from insurance (it may be cheaper with your insurance), but the website can give you the price if you did not use any insurance.  - You can print the associated coupon and take it with your prescription to the pharmacy.  - You may also stop by our office during regular business hours and pick up a GoodRx coupon card.  - If you need your prescription sent electronically to a different pharmacy, notify our office through [place] MyChart or by phone at [phone] option 4.  

## 2020-09-28 NOTE — Progress Notes (Signed)
New Patient Visit  Subjective  Brandon Salazar is a 60 y.o. male who presents for the following: Annual Exam (No history of skin cancer or abnormal mole - TBSE today). The patient presents for Total-Body Skin Exam (TBSE) for skin cancer screening and mole check.  The following portions of the chart were reviewed this encounter and updated as appropriate:   Tobacco  Allergies  Meds  Problems  Med Hx  Surg Hx  Fam Hx     Review of Systems:  No other skin or systemic complaints except as noted in HPI or Assessment and Plan.  Objective  Well appearing patient in no apparent distress; mood and affect are within normal limits.  A full examination was performed including scalp, head, eyes, ears, nose, lips, neck, chest, axillae, abdomen, back, buttocks, bilateral upper extremities, bilateral lower extremities, hands, feet, fingers, toes, fingernails, and toenails. All findings within normal limits unless otherwise noted below.  Left forehead x 2 (2) Erythematous thin papules/macules with gritty scale.   Back x 4 (4) Erythematous keratotic or waxy stuck-on papule or plaque.   Left Ankle - Anterior Firm pink/brown papulenodule with dimple sign.   Right prox post medial thigh Flesh colored papule   Assessment & Plan   Lentigines - Scattered tan macules - Due to sun exposure - Benign-appering, observe - Recommend daily broad spectrum sunscreen SPF 30+ to sun-exposed areas, reapply every 2 hours as needed. - Call for any changes  Seborrheic Keratoses - Stuck-on, waxy, tan-brown papules and/or plaques  - Benign-appearing - Discussed benign etiology and prognosis. - Observe - Call for any changes  Melanocytic Nevi - Tan-brown and/or pink-flesh-colored symmetric macules and papules - Benign appearing on exam today - Observation - Call clinic for new or changing moles - Recommend daily use of broad spectrum spf 30+ sunscreen to sun-exposed areas.   Hemangiomas - Red  papules - Discussed benign nature - Observe - Call for any changes  Actinic Damage - Chronic condition, secondary to cumulative UV/sun exposure - diffuse scaly erythematous macules with underlying dyspigmentation - Recommend daily broad spectrum sunscreen SPF 30+ to sun-exposed areas, reapply every 2 hours as needed.  - Staying in the shade or wearing long sleeves, sun glasses (UVA+UVB protection) and wide brim hats (4-inch brim around the entire circumference of the hat) are also recommended for sun protection.  - Call for new or changing lesions.  Skin cancer screening performed today.  AK (actinic keratosis) (2) Left forehead x 2  Destruction of lesion - Left forehead x 2 Complexity: simple   Destruction method: cryotherapy   Informed consent: discussed and consent obtained   Timeout:  patient name, date of birth, surgical site, and procedure verified Lesion destroyed using liquid nitrogen: Yes   Region frozen until ice ball extended beyond lesion: Yes   Outcome: patient tolerated procedure well with no complications   Post-procedure details: wound care instructions given    Inflamed seborrheic keratosis Back x 4  Destruction of lesion - Back x 4 Complexity: simple   Destruction method: cryotherapy   Informed consent: discussed and consent obtained   Timeout:  patient name, date of birth, surgical site, and procedure verified Lesion destroyed using liquid nitrogen: Yes   Region frozen until ice ball extended beyond lesion: Yes   Outcome: patient tolerated procedure well with no complications   Post-procedure details: wound care instructions given    Dermatofibroma Left Ankle - Anterior  Benign  Neoplasm of uncertain behavior of skin Right  prox post medial thigh  Epidermal / dermal shaving  Lesion diameter (cm):  0.6 Informed consent: discussed and consent obtained   Timeout: patient name, date of birth, surgical site, and procedure verified   Procedure prep:   Patient was prepped and draped in usual sterile fashion Prep type:  Isopropyl alcohol Anesthesia: the lesion was anesthetized in a standard fashion   Anesthetic:  1% lidocaine w/ epinephrine 1-100,000 buffered w/ 8.4% NaHCO3 Instrument used: flexible razor blade   Hemostasis achieved with: pressure, aluminum chloride and electrodesiccation   Outcome: patient tolerated procedure well   Post-procedure details: sterile dressing applied and wound care instructions given   Dressing type: bandage and petrolatum    Specimen 1 - Surgical pathology Differential Diagnosis: Skin tag vs nevus R/O dysplasia Check Margins: No  Skin cancer screening  Return in about 1 year (around 09/28/2021) for TBSE.  I, Ashok Cordia, CMA, am acting as scribe for Sarina Ser, MD . Documentation: I have reviewed the above documentation for accuracy and completeness, and I agree with the above.  Sarina Ser, MD

## 2020-10-02 ENCOUNTER — Encounter: Payer: Self-pay | Admitting: Dermatology

## 2020-10-03 ENCOUNTER — Telehealth: Payer: Self-pay

## 2020-10-03 DIAGNOSIS — Z23 Encounter for immunization: Secondary | ICD-10-CM | POA: Diagnosis not present

## 2020-10-03 NOTE — Telephone Encounter (Signed)
Advised patient of results/hd  

## 2020-10-03 NOTE — Telephone Encounter (Signed)
-----   Message from Ralene Bathe, MD sent at 09/30/2020  7:01 PM EDT ----- Diagnosis Skin , right prox post medial thigh [place], INFLAMED  Benign inflamed skin tag No further treatment needed

## 2020-10-19 ENCOUNTER — Other Ambulatory Visit: Payer: Self-pay | Admitting: Family Medicine

## 2020-11-09 ENCOUNTER — Ambulatory Visit: Payer: Self-pay

## 2020-11-09 ENCOUNTER — Other Ambulatory Visit: Payer: Self-pay

## 2020-11-09 DIAGNOSIS — M19031 Primary osteoarthritis, right wrist: Secondary | ICD-10-CM | POA: Diagnosis not present

## 2020-11-09 DIAGNOSIS — Z23 Encounter for immunization: Secondary | ICD-10-CM

## 2021-01-04 ENCOUNTER — Other Ambulatory Visit: Payer: Self-pay | Admitting: Family Medicine

## 2021-01-05 ENCOUNTER — Other Ambulatory Visit: Payer: Self-pay | Admitting: Family Medicine

## 2021-01-05 NOTE — Telephone Encounter (Signed)
Requested Prescriptions  Pending Prescriptions Disp Refills   rosuvastatin (CRESTOR) 10 MG tablet [Pharmacy Med Name: ROSUVASTATIN 10MG  TABLETS] 90 tablet 0    Sig: TAKE 1 TABLET(10 MG) BY MOUTH DAILY     Cardiovascular:  Antilipid - Statins Failed - 01/05/2021  8:00 AM      Failed - HDL in normal range and within 360 days    HDL  Date Value Ref Range Status  09/20/2020 35 (L) >39 mg/dL Final         Passed - Total Cholesterol in normal range and within 360 days    Cholesterol, Total  Date Value Ref Range Status  09/20/2020 122 100 - 199 mg/dL Final         Passed - LDL in normal range and within 360 days    LDL Cholesterol (Calc)  Date Value Ref Range Status  01/08/2017 77 mg/dL (calc) Final    Comment:    Reference range: <100 . Desirable range <100 mg/dL for primary prevention;   <70 mg/dL for patients with CHD or diabetic patients  with > or = 2 CHD risk factors. Marland Kitchen LDL-C is now calculated using the Martin-Hopkins  calculation, which is a validated novel method providing  better accuracy than the Friedewald equation in the  estimation of LDL-C.  Cresenciano Genre et al. Annamaria Helling. 7619;509(32): 2061-2068  (http://education.[employer].com/faq/FAQ164)    LDL Chol Calc (NIH)  Date Value Ref Range Status  09/20/2020 64 0 - 99 mg/dL Final         Passed - Triglycerides in normal range and within 360 days    Triglycerides  Date Value Ref Range Status  09/20/2020 131 0 - 149 mg/dL Final         Passed - Patient is not pregnant      Passed - Valid encounter within last 12 months    Recent Outpatient Visits          3 months ago [place] Jerrol Banana., MD   10 months ago Annual physical exam   [facility] Jerrol Banana., MD   1 year ago Annual physical exam   [facility] Jerrol Banana., MD   2 years ago [place] Jerrol Banana.,  MD   2 years ago Encounter for annual physical exam   [facility] Jerrol Banana., MD      Future Appointments            In 9 months Ralene Bathe, MD [place]

## 2021-01-06 NOTE — Telephone Encounter (Signed)
Interface surescripts requesting . Already signed 01/04/21 . Receipt confirmed by pharmacy 01/04/21 at 1:10 pm.  Requested Prescriptions  Refused Prescriptions Disp Refills   rosuvastatin (CRESTOR) 10 MG tablet [Pharmacy Med Name: ROSUVASTATIN 10MG  TABLETS] 90 tablet 0    Sig: TAKE 1 TABLET(10 MG) BY MOUTH DAILY     Cardiovascular:  Antilipid - Statins Failed - 01/05/2021  8:00 AM      Failed - HDL in normal range and within 360 days    HDL  Date Value Ref Range Status  09/20/2020 35 (L) >39 mg/dL Final         Passed - Total Cholesterol in normal range and within 360 days    Cholesterol, Total  Date Value Ref Range Status  09/20/2020 122 100 - 199 mg/dL Final         Passed - LDL in normal range and within 360 days    LDL Cholesterol (Calc)  Date Value Ref Range Status  01/08/2017 77 mg/dL (calc) Final    Comment:    Reference range: <100 . Desirable range <100 mg/dL for primary prevention;   <70 mg/dL for patients with CHD or diabetic patients  with > or = 2 CHD risk factors. Marland Kitchen LDL-C is now calculated using the Martin-Hopkins  calculation, which is a validated novel method providing  better accuracy than the Friedewald equation in the  estimation of LDL-C.  Cresenciano Genre et al. Annamaria Helling. 4259;563(87): 2061-2068  (http://education.[facility].com/faq/FAQ164)    LDL Chol Calc (NIH)  Date Value Ref Range Status  09/20/2020 64 0 - 99 mg/dL Final         Passed - Triglycerides in normal range and within 360 days    Triglycerides  Date Value Ref Range Status  09/20/2020 131 0 - 149 mg/dL Final         Passed - Patient is not pregnant      Passed - Valid encounter within last 12 months    Recent Outpatient Visits          3 months ago [place] Jerrol Banana., MD   10 months ago Annual physical exam   [facility] Jerrol Banana., MD   1 year ago Annual physical exam   [facility]  Jerrol Banana., MD   2 years ago [place] Jerrol Banana., MD   2 years ago Encounter for annual physical exam   [place] Jerrol Banana., MD      Future Appointments            In 9 months Ralene Bathe, MD [place]

## 2021-02-01 ENCOUNTER — Ambulatory Visit: Payer: BC Managed Care – PPO | Admitting: Dermatology

## 2021-03-01 ENCOUNTER — Ambulatory Visit (INDEPENDENT_AMBULATORY_CARE_PROVIDER_SITE_OTHER): Payer: BC Managed Care – PPO | Admitting: Family Medicine

## 2021-03-01 ENCOUNTER — Encounter: Payer: Self-pay | Admitting: Family Medicine

## 2021-03-01 ENCOUNTER — Other Ambulatory Visit: Payer: Self-pay

## 2021-03-01 VITALS — BP 118/76 | HR 76 | Temp 98.1°F | Resp 16 | Ht 72.0 in | Wt 213.0 lb

## 2021-03-01 DIAGNOSIS — Z1329 Encounter for screening for other suspected endocrine disorder: Secondary | ICD-10-CM

## 2021-03-01 DIAGNOSIS — Z125 Encounter for screening for malignant neoplasm of prostate: Secondary | ICD-10-CM | POA: Diagnosis not present

## 2021-03-01 DIAGNOSIS — R972 Elevated prostate specific antigen [PSA]: Secondary | ICD-10-CM

## 2021-03-01 DIAGNOSIS — E7800 Pure hypercholesterolemia, unspecified: Secondary | ICD-10-CM

## 2021-03-01 DIAGNOSIS — Z1211 Encounter for screening for malignant neoplasm of colon: Secondary | ICD-10-CM

## 2021-03-01 DIAGNOSIS — Z0000 Encounter for general adult medical examination without abnormal findings: Secondary | ICD-10-CM

## 2021-03-01 DIAGNOSIS — Z13228 Encounter for screening for other metabolic disorders: Secondary | ICD-10-CM

## 2021-03-01 LAB — IFOBT (OCCULT BLOOD): IFOBT: NEGATIVE

## 2021-03-01 NOTE — Progress Notes (Signed)
Complete physical exam  I,Brandon Salazar,acting as a scribe for Brandon Durie, MD.,have documented all relevant documentation on the behalf of Brandon Durie, MD,as directed by  Brandon Durie, MD while in the presence of Brandon Durie, MD.   Patient: Brandon Salazar   DOB: February 24, 1960   61 y.o. Male  MRN: [id number] Visit Date: 03/01/2021  Today's healthcare provider: [name], MD   Chief Complaint  Patient presents with   Annual Exam   Subjective    Brandon Salazar is a 61 y.o. male who presents today for a complete physical exam.  He reports consuming a general diet. Home exercise routine includes Home gym. He generally feels well. He reports sleeping well. He does not have additional problems to discuss today.  Patient feels great.  He is married and is a father of 4.  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  With dietary changes he has lost 27 pounds over the last year. HPI    History reviewed. No pertinent past medical history. Past Surgical History:  Procedure Laterality Date   ANTERIOR CRUCIATE LIGAMENT REPAIR     APPENDECTOMY     KNEE SURGERY     REPLACEMENT TOTAL KNEE     TOTAL HIP ARTHROPLASTY     Social History   Socioeconomic History   Marital status: Married    Spouse name: Not on file   Number of children: Not on file   Years of education: Not on file   Highest education level: Not on file  Occupational History   Not on file  Tobacco Use   Smoking status: Never   Smokeless tobacco: Never  Vaping Use   Vaping Use: Never used  Substance and Sexual Activity   Alcohol use: No   Drug use: No   Sexual activity: Yes    Birth control/protection: None  Other Topics Concern   Not on file  Social History Narrative   Not on file   Social Determinants of Health   Financial Resource Strain: Not on file  Food Insecurity: Not on file  Transportation Needs: Not on file  Physical Activity:  Not on file  Stress: Not on file  Social Connections: Not on file  Intimate Partner Violence: Not on file   Family Status  Relation Name Status   Mother  Alive   Father  Alive   Sister  Alive   Brother  Alive   Sister  Alive   Sister  Alive   MGF  (Not Specified)   Family History  Problem Relation Age of Onset   Stroke Mother    Arthritis Father    Heart disease Maternal Grandfather    Allergies  Allergen Reactions   Penicillins     Patient Care Team: Jerrol Banana., MD as PCP - General (Family Medicine)   Medications: Outpatient Medications Prior to Visit  Medication Sig   rosuvastatin (CRESTOR) 10 MG tablet TAKE 1 TABLET(10 MG) BY MOUTH DAILY   No facility-administered medications prior to visit.    Review of Systems  All other systems reviewed and are negative.     Objective    BP 118/76 (BP Location: Left Arm, Patient Position: Sitting, Cuff Size: Large)    Pulse 76    Temp 98.1 F (36.7 C) (Temporal)    Resp 16    Ht 6' (1.829 m)    Wt 213 lb (96.6  kg)    SpO2 96%    BMI 28.89 kg/m  BP Readings from Last 3 Encounters:  03/01/21 118/76  09/20/20 127/85  02/29/20 113/80   Wt Readings from Last 3 Encounters:  03/01/21 213 lb (96.6 kg)  09/20/20 240 lb (108.9 kg)  02/29/20 229 lb (103.9 kg)      Physical Exam Vitals reviewed.  Constitutional:      Appearance: Normal appearance. He is well-developed.  HENT:     Head: Normocephalic and atraumatic.     Right Ear: Tympanic membrane, ear canal and external ear normal.     Left Ear: Tympanic membrane, ear canal and external ear normal.  Eyes:     General:        Right eye: No discharge.        Left eye: No discharge.     Conjunctiva/sclera: Conjunctivae normal.     Pupils: Pupils are equal, round, and reactive to light.  Cardiovascular:     Rate and Rhythm: Normal rate and regular rhythm.     Heart sounds: Normal heart sounds.  Pulmonary:     Effort: Pulmonary effort is normal.     Breath  sounds: Normal breath sounds.  Abdominal:     Palpations: Abdomen is soft.  Genitourinary:    Penis: Normal.      Testes: Normal.     Prostate: Normal.     Rectum: Normal. Guaiac result negative.     Comments: Prostate enlarged without nodule.   Musculoskeletal:     Cervical back: Normal range of motion and neck supple.     Right lower leg: No edema.     Left lower leg: No edema.  Skin:    General: Skin is warm and dry.  Neurological:     General: No focal deficit present.     Mental Status: He is alert and oriented to person, place, and time. Mental status is at baseline.  Psychiatric:        Mood and Affect: Mood normal.        Behavior: Behavior normal.        Thought Content: Thought content normal.        Judgment: Judgment normal.      Last depression screening scores PHQ 2/9 Scores 03/01/2021 09/20/2020 02/29/2020  PHQ - 2 Score 0 0 0  PHQ- 9 Score 0 0 0   Last fall risk screening Fall Risk  03/01/2021  Falls in the past year? 0  Number falls in past yr: 0  Injury with Fall? 0  Risk for fall due to : No Fall Risks  Follow up Falls evaluation completed   Last Audit-C alcohol use screening Alcohol Use Disorder Test (AUDIT) 03/01/2021  1. How often do you have a drink containing alcohol? 0  2. How many drinks containing alcohol do you have on a typical day when you are drinking? 0  3. How often do you have six or more drinks on one occasion? 0  AUDIT-C Score 0  Alcohol Brief Interventions/Follow-up -   A score of 3 or more in women, and 4 or more in men indicates increased risk for alcohol abuse, EXCEPT if all of the points are from question 1   Results for orders placed or performed in visit on 03/01/21  IFOBT POC (occult bld, rslt in office)  Result Value Ref Range   IFOBT Negative     Assessment & Plan    Routine Health Maintenance and Physical Exam  Exercise  Activities and Dietary recommendations  Goals   None     Immunization History  Administered  Date(s) Administered   Influenza Whole 10/04/2015   Influenza,inj,Quad PF,6+ Mos 10/13/2018, 11/09/2020   Influenza-Unspecified 10/02/2016, 11/01/2017, 10/21/2019   Moderna Sars-Covid-2 Vaccination 03/24/2019, 04/21/2019, 11/17/2019   Td 12/21/2015   Tdap 10/03/2005   Zoster Recombinat (Shingrix) 12/24/2019, 04/01/2020    Health Maintenance  Topic Date Due   HIV Screening  Never done   COVID-19 Vaccine (4 - Booster for Moderna series) 01/12/2020   COLONOSCOPY (Pts 45-92yrs Insurance coverage will need to be confirmed)  01/09/2023   TETANUS/TDAP  12/20/2025   INFLUENZA VACCINE  Completed   Hepatitis C Screening  Completed   Zoster Vaccines- Shingrix  Completed   HPV VACCINES  Aged Out    Discussed health benefits of physical activity, and encouraged him to engage in regular exercise appropriate for his age and condition.  1. Annual physical exam  - Lipid panel - TSH - CBC w/Diff/Platelet - Comprehensive Metabolic Panel (CMET)  2. Elevated PSA Patient wishes to avoid intervention if it does not rise further - PSA  3. Hypercholesteremia  - Lipid panel - TSH - CBC w/Diff/Platelet - Comprehensive Metabolic Panel (CMET)  4. Prostate cancer screening  - PSA  5. Screening for thyroid disorder  - TSH - CBC w/Diff/Platelet - Comprehensive Metabolic Panel (CMET)  6. Screening for metabolic disorder  - CBC w/Diff/Platelet - Comprehensive Metabolic Panel (CMET)  7. Encounter for screening fecal occult blood testing  - IFOBT POC (occult bld, rslt in office)--Negative   Return in about 6 months (around 08/29/2021).     I, Brandon Durie, MD, have reviewed all documentation for this visit. The documentation on 03/03/21 for the exam, diagnosis, procedures, and orders are all accurate and complete.    Larry Knipp Cranford Mon, MD  [facility] [phone] (phone) [phone] (fax)  [place]

## 2021-03-02 DIAGNOSIS — R972 Elevated prostate specific antigen [PSA]: Secondary | ICD-10-CM | POA: Diagnosis not present

## 2021-03-02 DIAGNOSIS — Z1329 Encounter for screening for other suspected endocrine disorder: Secondary | ICD-10-CM | POA: Diagnosis not present

## 2021-03-02 DIAGNOSIS — Z125 Encounter for screening for malignant neoplasm of prostate: Secondary | ICD-10-CM | POA: Diagnosis not present

## 2021-03-02 DIAGNOSIS — Z13228 Encounter for screening for other metabolic disorders: Secondary | ICD-10-CM | POA: Diagnosis not present

## 2021-03-02 DIAGNOSIS — Z0000 Encounter for general adult medical examination without abnormal findings: Secondary | ICD-10-CM | POA: Diagnosis not present

## 2021-03-02 DIAGNOSIS — E7800 Pure hypercholesterolemia, unspecified: Secondary | ICD-10-CM | POA: Diagnosis not present

## 2021-03-03 LAB — CBC WITH DIFFERENTIAL/PLATELET
Basophils Absolute: 0.1 10*3/uL (ref 0.0–0.2)
Basos: 1 %
EOS (ABSOLUTE): 0.1 10*3/uL (ref 0.0–0.4)
Eos: 1 %
Hematocrit: 42.9 % (ref 37.5–51.0)
Hemoglobin: 14.9 g/dL (ref 13.0–17.7)
Immature Grans (Abs): 0 10*3/uL (ref 0.0–0.1)
Immature Granulocytes: 0 %
Lymphocytes Absolute: 1.9 10*3/uL (ref 0.7–3.1)
Lymphs: 29 %
MCH: 31.4 pg (ref 26.6–33.0)
MCHC: 34.7 g/dL (ref 31.5–35.7)
MCV: 90 fL (ref 79–97)
Monocytes Absolute: 0.7 10*3/uL (ref 0.1–0.9)
Monocytes: 10 %
Neutrophils Absolute: 3.9 10*3/uL (ref 1.4–7.0)
Neutrophils: 59 %
Platelets: 222 10*3/uL (ref 150–450)
RBC: 4.75 x10E6/uL (ref 4.14–5.80)
RDW: 12.5 % (ref 11.6–15.4)
WBC: 6.6 10*3/uL (ref 3.4–10.8)

## 2021-03-03 LAB — COMPREHENSIVE METABOLIC PANEL
ALT: 18 IU/L (ref 0–44)
AST: 18 IU/L (ref 0–40)
Albumin/Globulin Ratio: 2.1 (ref 1.2–2.2)
Albumin: 4.2 g/dL (ref 3.8–4.8)
Alkaline Phosphatase: 78 IU/L (ref 44–121)
BUN/Creatinine Ratio: 14 (ref 10–24)
BUN: 15 mg/dL (ref 8–27)
Bilirubin Total: 1 mg/dL (ref 0.0–1.2)
CO2: 24 mmol/L (ref 20–29)
Calcium: 9 mg/dL (ref 8.6–10.2)
Chloride: 106 mmol/L (ref 96–106)
Creatinine, Ser: 1.07 mg/dL (ref 0.76–1.27)
Globulin, Total: 2 g/dL (ref 1.5–4.5)
Glucose: 90 mg/dL (ref 70–99)
Potassium: 4.5 mmol/L (ref 3.5–5.2)
Sodium: 143 mmol/L (ref 134–144)
Total Protein: 6.2 g/dL (ref 6.0–8.5)
eGFR: 78 mL/min/{1.73_m2} (ref >59)

## 2021-03-03 LAB — TSH: TSH: 1.65 u[IU]/mL (ref 0.450–4.500)

## 2021-03-03 LAB — LIPID PANEL
Chol/HDL Ratio: 3.2 ratio (ref 0.0–5.0)
Cholesterol, Total: 129 mg/dL (ref 100–199)
HDL: 40 mg/dL (ref >39)
LDL Chol Calc (NIH): 71 mg/dL (ref 0–99)
Triglycerides: 94 mg/dL (ref 0–149)
VLDL Cholesterol Cal: 18 mg/dL (ref 5–40)

## 2021-03-03 LAB — PSA: Prostate Specific Ag, Serum: 7.3 ng/mL — ABNORMAL HIGH (ref 0.0–4.0)

## 2021-06-30 ENCOUNTER — Other Ambulatory Visit: Payer: Self-pay | Admitting: Family Medicine

## 2021-06-30 MED ORDER — ROSUVASTATIN CALCIUM 10 MG PO TABS: 10.0000 mg | ORAL_TABLET | Freq: Every day | ORAL | 0 refills | Status: DC

## 2021-06-30 NOTE — Telephone Encounter (Signed)
Wife called and wants to make sure this Rx is filled for a year. rosuvastatin (CRESTOR) 10 MG tablet  [id number] - [place], [place] - [address]

## 2021-06-30 NOTE — Telephone Encounter (Signed)
Duplicate request- filled 06/30/21 #90 Requested Prescriptions  Pending Prescriptions Disp Refills  . rosuvastatin (CRESTOR) 10 MG tablet [Pharmacy Med Name: ROSUVASTATIN '10MG'$  TABLETS] 90 tablet 0    Sig: TAKE 1 TABLET(10 MG) BY MOUTH DAILY     Cardiovascular:  Antilipid - Statins 2 Failed - 06/30/2021  9:25 AM      Failed - Lipid Panel in normal range within the last 12 months    Cholesterol, Total  Date Value Ref Range Status  03/02/2021 129 100 - 199 mg/dL Final   LDL Cholesterol (Calc)  Date Value Ref Range Status  01/08/2017 77 mg/dL (calc) Final    Comment:    Reference range: <100 . Desirable range <100 mg/dL for primary prevention;   <70 mg/dL for patients with CHD or diabetic patients  with > or = 2 CHD risk factors. Marland Kitchen LDL-C is now calculated using the Martin-Hopkins  calculation, which is a validated novel method providing  better accuracy than the Friedewald equation in the  estimation of LDL-C.  Cresenciano Genre et al. Annamaria Helling. 0092;330(07): 2061-2068  (http://education.[facility].com/faq/FAQ164)    LDL Chol Calc (NIH)  Date Value Ref Range Status  03/02/2021 71 0 - 99 mg/dL Final   HDL  Date Value Ref Range Status  03/02/2021 40 >39 mg/dL Final   Triglycerides  Date Value Ref Range Status  03/02/2021 94 0 - 149 mg/dL Final         Passed - Cr in normal range and within 360 days    Creat  Date Value Ref Range Status  01/08/2017 1.14 0.70 - 1.33 mg/dL Final    Comment:    For patients >56 years of age, the reference limit for Creatinine is approximately 13% higher for people identified as African-American. .    Creatinine, Ser  Date Value Ref Range Status  03/02/2021 1.07 0.76 - 1.27 mg/dL Final         Passed - Patient is not pregnant      Passed - Valid encounter within last 12 months    Recent Outpatient Visits          4 months ago Annual physical exam   [facility] Jerrol Banana., MD   9 months ago [place] Jerrol Banana., MD   1 year ago Annual physical exam   [facility] Jerrol Banana., MD   2 years ago Annual physical exam   [facility] Jerrol Banana., MD   2 years ago [place] Jerrol Banana., MD      Future Appointments            In 3 days Jerrol Banana., MD [facility], [place]   In 3 months Jerrol Banana., MD [facility], [place]   In 3 months Ralene Bathe, MD [place]   In 8 months Jerrol Banana., MD [facility], [place]

## 2021-07-03 ENCOUNTER — Ambulatory Visit: Payer: BC Managed Care – PPO | Admitting: Family Medicine

## 2021-07-03 ENCOUNTER — Encounter: Payer: Self-pay | Admitting: Family Medicine

## 2021-07-03 ENCOUNTER — Other Ambulatory Visit: Payer: Self-pay | Admitting: Family Medicine

## 2021-07-03 VITALS — BP 107/73 | HR 60 | Resp 16 | Wt 216.0 lb

## 2021-07-03 DIAGNOSIS — M1990 Unspecified osteoarthritis, unspecified site: Secondary | ICD-10-CM

## 2021-07-03 DIAGNOSIS — N400 Enlarged prostate without lower urinary tract symptoms: Secondary | ICD-10-CM | POA: Diagnosis not present

## 2021-07-03 DIAGNOSIS — E7800 Pure hypercholesterolemia, unspecified: Secondary | ICD-10-CM

## 2021-07-03 DIAGNOSIS — Z9889 Other specified postprocedural states: Secondary | ICD-10-CM

## 2021-07-03 DIAGNOSIS — R051 Acute cough: Secondary | ICD-10-CM | POA: Diagnosis not present

## 2021-07-03 DIAGNOSIS — J309 Allergic rhinitis, unspecified: Secondary | ICD-10-CM

## 2021-07-03 DIAGNOSIS — R972 Elevated prostate specific antigen [PSA]: Secondary | ICD-10-CM

## 2021-07-03 DIAGNOSIS — J069 Acute upper respiratory infection, unspecified: Secondary | ICD-10-CM | POA: Diagnosis not present

## 2021-07-03 MED ORDER — CETIRIZINE HCL 10 MG PO TABS: 10.0000 mg | ORAL_TABLET | Freq: Every day | ORAL | 1 refills | Status: DC

## 2021-07-03 MED ORDER — FLUTICASONE PROPIONATE 50 MCG/ACT NA SUSP: 2.0000 | Freq: Every day | NASAL | 6 refills | Status: DC

## 2021-07-03 NOTE — Patient Instructions (Signed)
TRY OVER-THE-COUNTER TURMERIC DAILY FOR ARTHRITIC PAIN IN HANDS. ALSO TAKE ROBITUSSIN TWICE A DAY, VITAMIN C, VITAMIN D, ZINC DAILY UNTIL SYMPTOMS HAVE RESOLVED.

## 2021-07-03 NOTE — Progress Notes (Unsigned)
      Established patient visit  I,Brandon Salazar,acting as a scribe for Brandon Durie, MD.,have documented all relevant documentation on the behalf of Brandon Durie, MD,as directed by  Brandon Durie, MD while in the presence of Brandon Durie, MD.   Patient: Brandon Salazar   DOB: Sep 09, 1960   61 y.o. Male  MRN: [id number] Visit Date: 07/03/2021  Today's healthcare provider: [name], MD   Chief Complaint  Patient presents with   URI   Hand Pain   Subjective    HPI  Upper respiratory symptoms He complains of post nasal drip.with n/a. Onset of symptoms was a few days ago and improving.He is drinking plenty of fluids.  Past history is significant for no history of pneumonia or bronchitis. Patient is non-smoker  ---------------------------------------------------------------------------------------------------  Patient also has bilateral hand pain.    Medications: Outpatient Medications Prior to Visit  Medication Sig   rosuvastatin (CRESTOR) 10 MG tablet TAKE 1 TABLET(10 MG) BY MOUTH DAILY   No facility-administered medications prior to visit.    Review of Systems  Constitutional:  Negative for appetite change, chills and fever.  Respiratory:  Negative for chest tightness, shortness of breath and wheezing.   Cardiovascular:  Negative for chest pain and palpitations.  Gastrointestinal:  Negative for abdominal pain, nausea and vomiting.    {Labs  Heme  Chem  Endocrine  Serology  Results Review (optional):23779}   Objective    BP 107/73 (BP Location: Left Arm, Patient Position: Sitting, Cuff Size: Large)   Pulse 60   Resp 16   Wt 216 lb (98 kg)   SpO2 98%   BMI 29.29 kg/m  {Show previous vital signs (optional):23777}  Physical Exam  ***  No results found for any visits on 07/03/21.  Assessment & Plan     ***  No follow-ups on file.      {provider attestation***:1}   Brandon Durie, MD  [facility] [phone] (phone) [phone] (fax)  [place]

## 2021-07-10 IMAGING — MR MR PROSTATE WO/W CM
23 of 56 series · 23 of 56 positions shown · IV contrast (yes)
Comparison: None.

CLINICAL DATA: Elevated PSA.  No biopsy.  PSA greater than 3

EXAM:
MR PROSTATE WITHOUT AND WITH CONTRAST
TECHNIQUE: Multiplanar multisequence MRI images were obtained of the pelvis
centered about the prostate. Pre and post contrast images were
obtained. Exam is degraded by the bilateral hip prosthetics
CONTRAST:  10mL GADAVIST GADOBUTROL 1 MMOL/ML IV SOLN

[Series 1: (id) loc · axial · 8.0mm · 0.78mm/px · 1 of 15 slices shown]
[im 1/15]
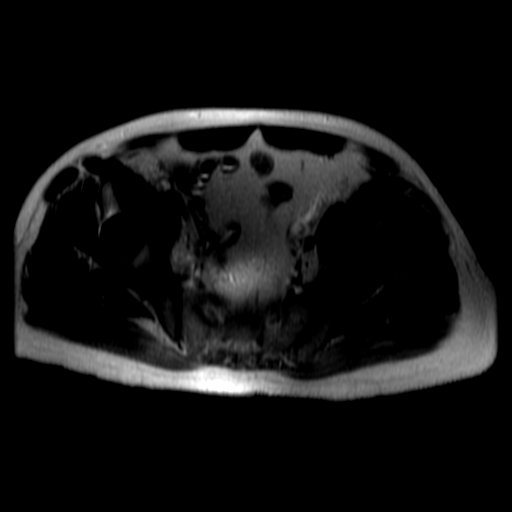

[Series 3: bSSFP fat-sat · axial · 6.0mm · 0.86mm/px · 1 of 44 slices shown]
[im 1/44]
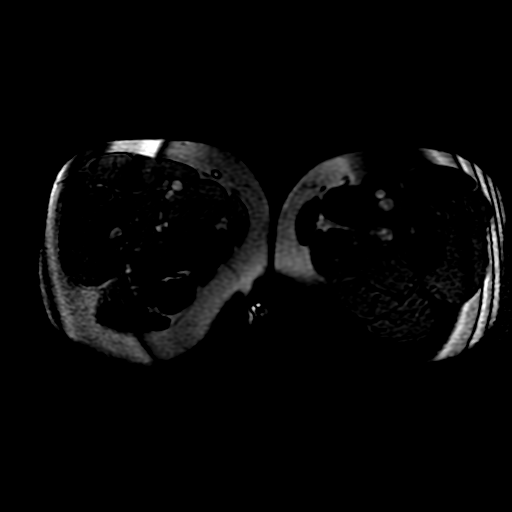

[Series 4: T1 · axial · 6.0mm · 0.86mm/px · 1 of 44 slices shown (1 of 2)]
[im 1/44]
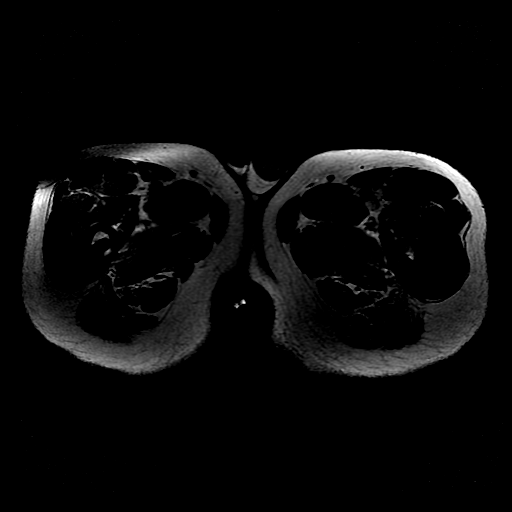

[Series 5: T2 · axial · 3.0mm · 0.29mm/px · 1 of 29 slices shown (1 of 4)]
[im 1/29]
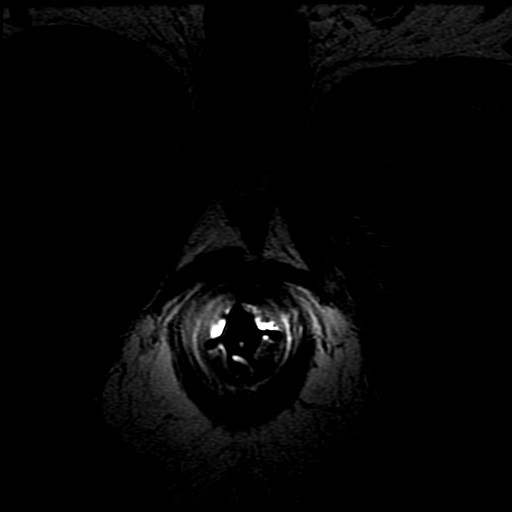

[Series 6: T1 · axial · 3.0mm · 0.29mm/px · 1 of 29 slices shown (2 of 2)]
[im 1/29]
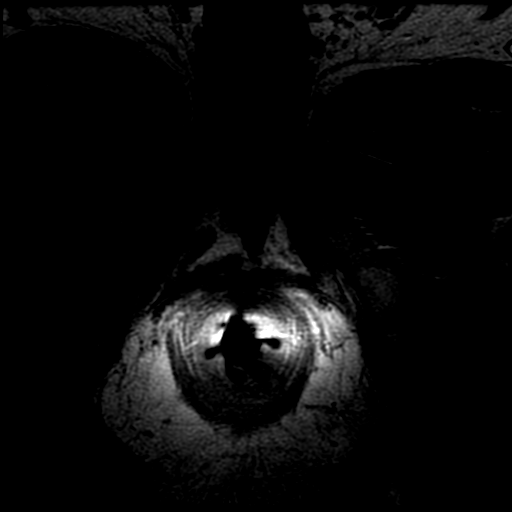

[Series 7: T2 · axial · 1.8mm · 0.47mm/px · 1 of 124 slices shown (2 of 4)]
[im 1/124]
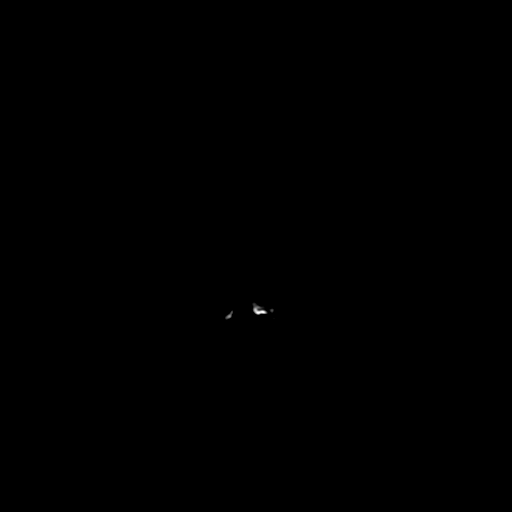

[Series 8: T2 · sagittal · 4.0mm · 0.29mm/px · 1 of 27 slices shown (3 of 4)]
[im 1/27]
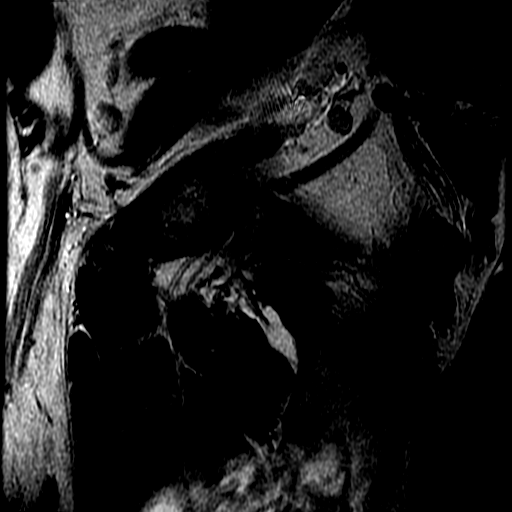

[Series 9: T2 · coronal · 4.0mm · 0.29mm/px · 1 of 22 slices shown (4 of 4)]
[im 1/22]
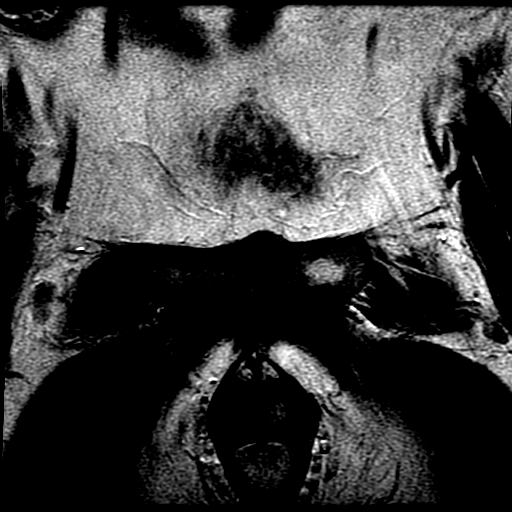

[Series 10: DWI · axial · 3.0mm · 0.59mm/px · 1 of 54 slices shown (1 of 6)]
[im 1/54]
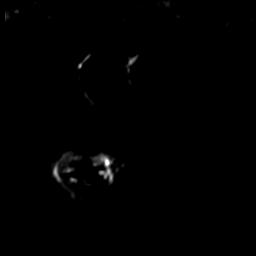

[Series 11: DWI · axial · 3.0mm · 0.59mm/px · 1 of 56 slices shown (2 of 6)]
[im 1/56]
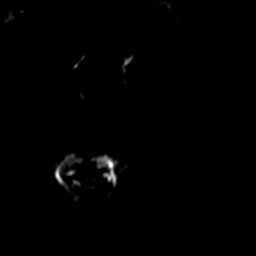

[Series 12: DWI · axial · 3.0mm · 0.59mm/px · 1 of 56 slices shown (3 of 6)]
[im 1/56]
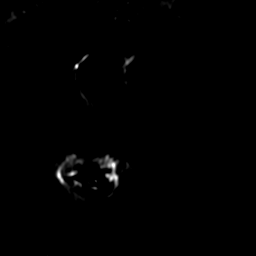

[Series 700: reformatted · axial · 1.8mm · 0.47mm/px · 1 of 102 slices shown (1 of 2)]
[im 1/102]
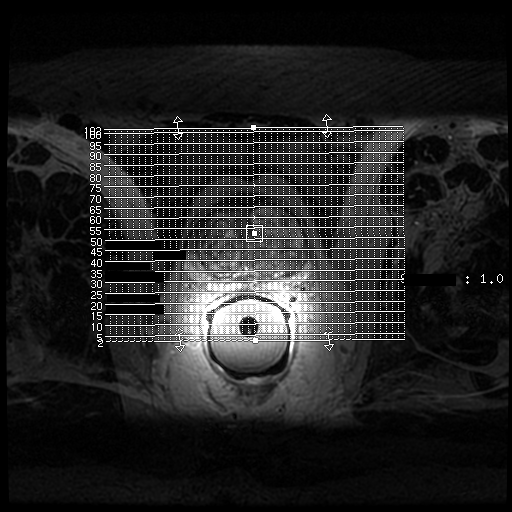

[Series 701: reformatted · axial · 1.8mm · 0.47mm/px · 1 of 102 slices shown (2 of 2)]
[im 1/102]
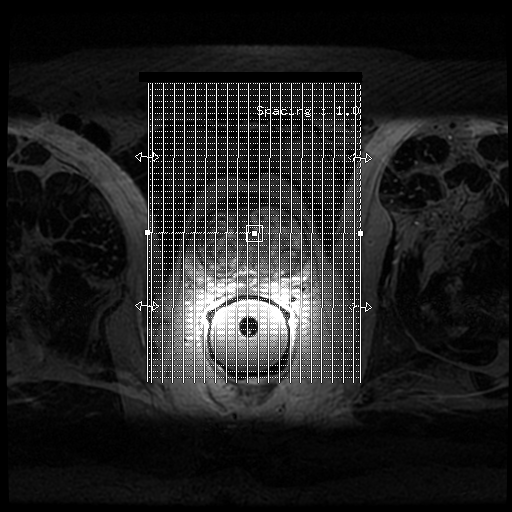

[Series 1000: DWI · axial · 3.0mm · 0.59mm/px · 1 of 29 slices shown (4 of 6)]
[im 1/29]
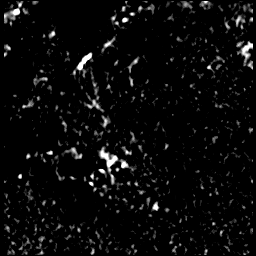

[Series 1100: DWI · axial · 3.0mm · 0.59mm/px · 1 of 29 slices shown (5 of 6)]
[im 1/29]
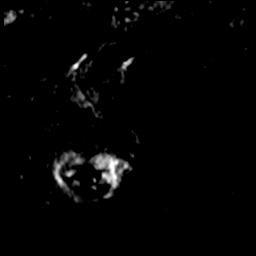

[Series 1200: DWI · axial · 3.0mm · 0.59mm/px · 1 of 27 slices shown (6 of 6)]
[im 1/27]
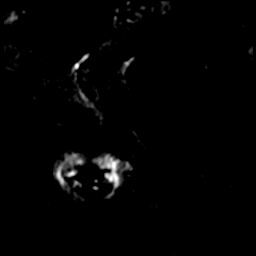

[((id)/(id)/72)-((id)/(id)/72) · axial · 3.0mm · 0.43mm/px · 1 of 72 slices shown (1 of 7)]
[im 1/72]
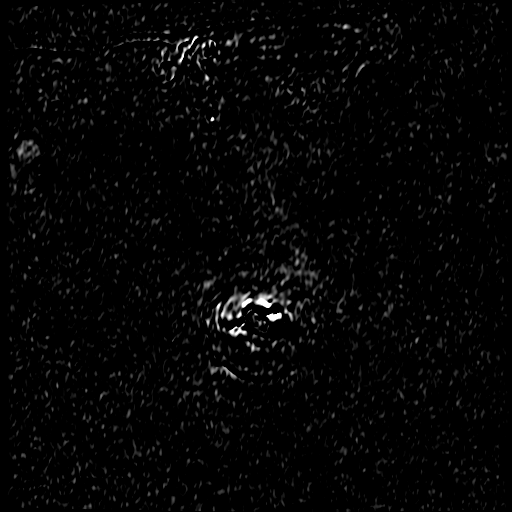

[((id)/(id)/72)-((id)/(id)/72) · axial · 3.0mm · 0.43mm/px · 1 of 71 slices shown (2 of 7)]
[im 1/71]
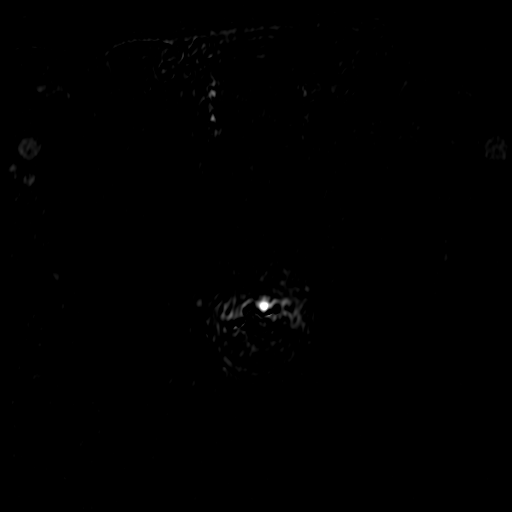

[((id)/(id)/72)-((id)/(id)/72) · axial · 3.0mm · 0.43mm/px · 1 of 72 slices shown (3 of 7)]
[im 1/72]
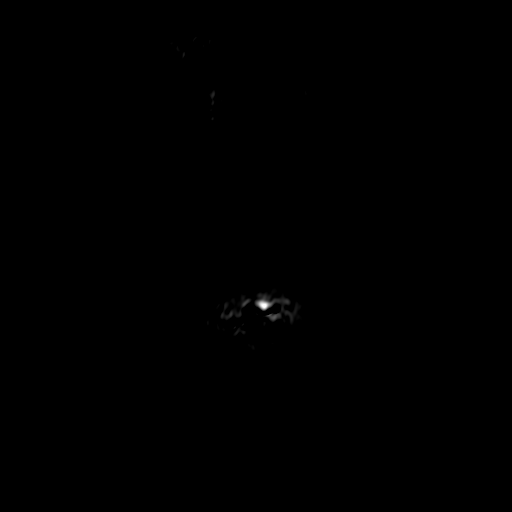

[((id)/(id)/72)-((id)/(id)/72) · axial · 3.0mm · 0.43mm/px · 1 of 72 slices shown (4 of 7)]
[im 1/72]
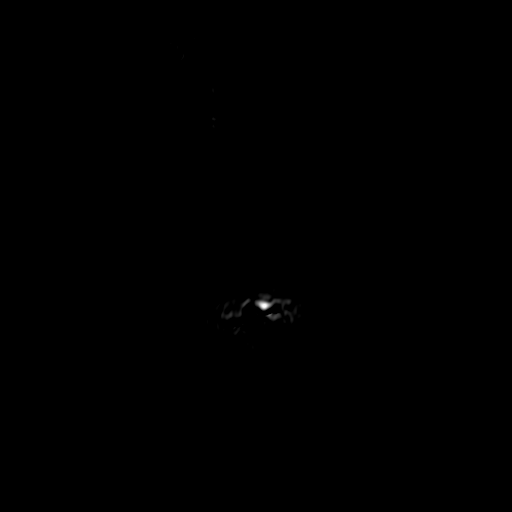

[((id)/(id)/72)-((id)/(id)/72) · axial · 3.0mm · 0.43mm/px · 1 of 72 slices shown (5 of 7)]
[im 1/72]
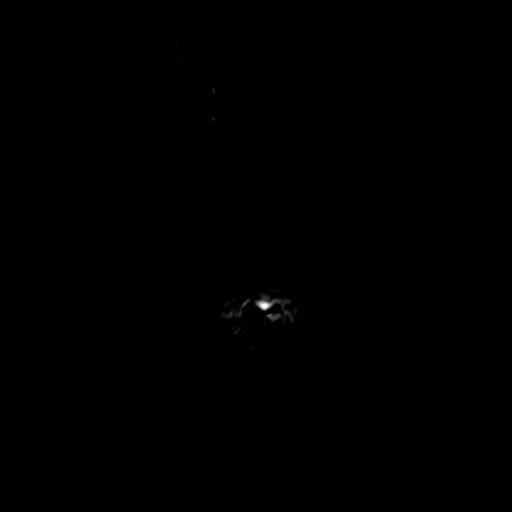

[((id)/(id)/72)-((id)/(id)/72) · axial · 3.0mm · 0.43mm/px · 1 of 72 slices shown (6 of 7)]
[im 1/72]
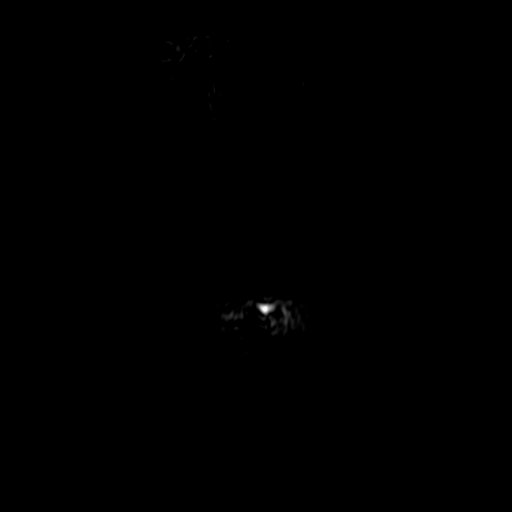

[((id)/(id)/72)-((id)/(id)/72) · axial · 3.0mm · 0.43mm/px · 1 of 72 slices shown (7 of 7)]
[im 1/72]
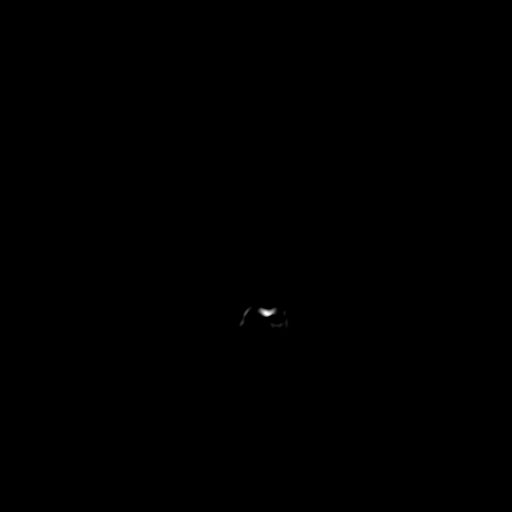

[23 of 56 positions shown; findings below may reference images not displayed]

FINDINGS: Exam is degraded by signal artifact generated from the bilateral hip
prosthetics. The RIGHT prosthetic artifact is worse than the LEFT
with both relatively severe. The artifacts most severely affect the
diffusion-weighted imaging

Prostate: No clear focal lesion in the peripheral zone on T2
weighted imaging. There is band of linear low intensity with angular
margins within the LEFT and RIGHT mid gland peripheral zone favored
represent benign stromal tissue (image [DATE]). No clear lesion on
restricted diffusion however the restricted diffusion imaging is the
most severely degraded by the artifact.

No abnormal enhancement in the peripheral zone.

The nodular zone is enlarged by multiple capsulated nodules. All of
the nodules have bland intermediate signal intensity is slightly
atypical.

Volume: 4.3 x 3.7 by

Transcapsular spread:  Absent

Seminal vesicle involvement: Absent

Neurovascular bundle involvement: Absent

Pelvic adenopathy: Absent

Bone metastasis: Absent

Other findings:
IMPRESSION: 1. Exam is markedly degraded by signal artifact generated from the
bilateral hip prosthetics. Diffusion-weighted imaging severely
degraded
2. Angular lesions in LEFT and RIGHT peripheral zone are favored
benign stromal tissue ( PI-RADS: 2).
3. Enlarged transitional zone with capsulated nodules somewhat bland
signal intensity. Favor benign prostate hypertrophy. (PI-RADS: 2)

## 2021-10-02 ENCOUNTER — Ambulatory Visit: Payer: BC Managed Care – PPO | Admitting: Family Medicine

## 2021-10-02 ENCOUNTER — Encounter: Payer: Self-pay | Admitting: Family Medicine

## 2021-10-02 VITALS — BP 122/79 | HR 64 | Resp 16 | Wt 211.0 lb

## 2021-10-02 DIAGNOSIS — Z1329 Encounter for screening for other suspected endocrine disorder: Secondary | ICD-10-CM

## 2021-10-02 DIAGNOSIS — Z13228 Encounter for screening for other metabolic disorders: Secondary | ICD-10-CM | POA: Diagnosis not present

## 2021-10-02 DIAGNOSIS — J309 Allergic rhinitis, unspecified: Secondary | ICD-10-CM

## 2021-10-02 DIAGNOSIS — E7800 Pure hypercholesterolemia, unspecified: Secondary | ICD-10-CM

## 2021-10-02 DIAGNOSIS — R972 Elevated prostate specific antigen [PSA]: Secondary | ICD-10-CM | POA: Diagnosis not present

## 2021-10-02 MED ORDER — ROSUVASTATIN CALCIUM 10 MG PO TABS: ORAL_TABLET | ORAL | 3 refills | Status: AC

## 2021-10-02 NOTE — Progress Notes (Unsigned)
      Established patient visit  I,Brandon Salazar,acting as a scribe for Brandon Durie, MD.,have documented all relevant documentation on the behalf of Brandon Durie, MD,as directed by  Brandon Durie, MD while in the presence of Brandon Durie, MD.   Patient: Brandon Salazar   DOB: 17-Sep-1960   61 y.o. Male  MRN: [id number] Visit Date: 10/02/2021  Today's healthcare provider: [name], MD   Chief Complaint  Patient presents with   Follow-up   Subjective    HPI  Patient is here for 3 month follow up on chronic problems.  Medications: Outpatient Medications Prior to Visit  Medication Sig   rosuvastatin (CRESTOR) 10 MG tablet TAKE 1 TABLET(10 MG) BY MOUTH DAILY   [DISCONTINUED] cetirizine (ZYRTEC ALLERGY) 10 MG tablet Take 1 tablet (10 mg total) by mouth daily.   [DISCONTINUED] fluticasone (FLONASE) 50 MCG/ACT nasal spray Place 2 sprays into both nostrils daily.   No facility-administered medications prior to visit.    Review of Systems  {Labs  Heme  Chem  Endocrine  Serology  Results Review (optional):23779}   Objective    BP 122/79 (BP Location: Right Arm, Patient Position: Sitting, Cuff Size: Large)   Pulse 64   Resp 16   Wt 211 lb (95.7 kg)   SpO2 97%   BMI 28.62 kg/m  {Show previous vital signs (optional):23777}  Physical Exam  ***  No results found for any visits on 10/02/21.  Assessment & Plan     ***  No follow-ups on file.      {provider attestation***:1}   Brandon Durie, MD  [facility] [phone] (phone) [phone] (fax)  [place]

## 2021-10-03 LAB — CBC WITH DIFFERENTIAL/PLATELET
Basophils Absolute: 0.1 10*3/uL (ref 0.0–0.2)
Basos: 1 %
EOS (ABSOLUTE): 0.1 10*3/uL (ref 0.0–0.4)
Eos: 2 %
Hematocrit: 44 % (ref 37.5–51.0)
Hemoglobin: 15.5 g/dL (ref 13.0–17.7)
Immature Grans (Abs): 0 10*3/uL (ref 0.0–0.1)
Immature Granulocytes: 0 %
Lymphocytes Absolute: 2.1 10*3/uL (ref 0.7–3.1)
Lymphs: 24 %
MCH: 31.3 pg (ref 26.6–33.0)
MCHC: 35.2 g/dL (ref 31.5–35.7)
MCV: 89 fL (ref 79–97)
Monocytes Absolute: 0.7 10*3/uL (ref 0.1–0.9)
Monocytes: 8 %
Neutrophils Absolute: 5.9 10*3/uL (ref 1.4–7.0)
Neutrophils: 65 %
Platelets: 232 10*3/uL (ref 150–450)
RBC: 4.96 x10E6/uL (ref 4.14–5.80)
RDW: 12.4 % (ref 11.6–15.4)
WBC: 9 10*3/uL (ref 3.4–10.8)

## 2021-10-03 LAB — COMPREHENSIVE METABOLIC PANEL
ALT: 13 IU/L (ref 0–44)
AST: 16 IU/L (ref 0–40)
Albumin/Globulin Ratio: 2.1 (ref 1.2–2.2)
Albumin: 4.7 g/dL (ref 3.9–4.9)
Alkaline Phosphatase: 91 IU/L (ref 44–121)
BUN/Creatinine Ratio: 16 (ref 10–24)
BUN: 16 mg/dL (ref 8–27)
Bilirubin Total: 0.9 mg/dL (ref 0.0–1.2)
CO2: 26 mmol/L (ref 20–29)
Calcium: 9.3 mg/dL (ref 8.6–10.2)
Chloride: 105 mmol/L (ref 96–106)
Creatinine, Ser: 1.02 mg/dL (ref 0.76–1.27)
Globulin, Total: 2.2 g/dL (ref 1.5–4.5)
Glucose: 88 mg/dL (ref 70–99)
Potassium: 4.4 mmol/L (ref 3.5–5.2)
Sodium: 146 mmol/L — ABNORMAL HIGH (ref 134–144)
Total Protein: 6.9 g/dL (ref 6.0–8.5)
eGFR: 83 mL/min/{1.73_m2} (ref >59)

## 2021-10-03 LAB — LIPID PANEL
Chol/HDL Ratio: 4.3 ratio (ref 0.0–5.0)
Cholesterol, Total: 147 mg/dL (ref 100–199)
HDL: 34 mg/dL — ABNORMAL LOW (ref >39)
LDL Chol Calc (NIH): 84 mg/dL (ref 0–99)
Triglycerides: 170 mg/dL — ABNORMAL HIGH (ref 0–149)
VLDL Cholesterol Cal: 29 mg/dL (ref 5–40)

## 2021-10-03 LAB — TSH: TSH: 2.48 u[IU]/mL (ref 0.450–4.500)

## 2021-10-03 LAB — PSA: Prostate Specific Ag, Serum: 11.6 ng/mL — ABNORMAL HIGH (ref 0.0–4.0)

## 2021-10-04 ENCOUNTER — Ambulatory Visit: Payer: BC Managed Care – PPO | Admitting: Dermatology

## 2021-10-04 DIAGNOSIS — L814 Other melanin hyperpigmentation: Secondary | ICD-10-CM | POA: Diagnosis not present

## 2021-10-04 DIAGNOSIS — L821 Other seborrheic keratosis: Secondary | ICD-10-CM

## 2021-10-04 DIAGNOSIS — L578 Other skin changes due to chronic exposure to nonionizing radiation: Secondary | ICD-10-CM | POA: Diagnosis not present

## 2021-10-04 DIAGNOSIS — Z1283 Encounter for screening for malignant neoplasm of skin: Secondary | ICD-10-CM | POA: Diagnosis not present

## 2021-10-04 DIAGNOSIS — L820 Inflamed seborrheic keratosis: Secondary | ICD-10-CM

## 2021-10-04 DIAGNOSIS — B360 Pityriasis versicolor: Secondary | ICD-10-CM | POA: Diagnosis not present

## 2021-10-04 DIAGNOSIS — L570 Actinic keratosis: Secondary | ICD-10-CM

## 2021-10-04 DIAGNOSIS — D1801 Hemangioma of skin and subcutaneous tissue: Secondary | ICD-10-CM

## 2021-10-04 MED ORDER — KETOCONAZOLE 2 % EX SHAM: MEDICATED_SHAMPOO | CUTANEOUS | 6 refills | Status: DC

## 2021-10-04 NOTE — Progress Notes (Signed)
Follow-Up Visit   Subjective  Brandon Salazar is a 61 y.o. male who presents for the following: Annual Exam (Mole check ). Hx of Aks. The patient presents for Total-Body Skin Exam (TBSE) for skin cancer screening and mole check.  The patient has spots, moles and lesions to be evaluated, some may be new or changing and the patient has concerns that these could be cancer.  Pt c/o irritated rash on his neck.   The following portions of the chart were reviewed this encounter and updated as appropriate:   Tobacco  Allergies  Meds  Problems  Med Hx  Surg Hx  Fam Hx     Review of Systems:  No other skin or systemic complaints except as noted in HPI or Assessment and Plan.  Objective  Well appearing patient in no apparent distress; mood and affect are within normal limits.  A full examination was performed including scalp, head, eyes, ears, nose, lips, neck, chest, axillae, abdomen, back, buttocks, bilateral upper extremities, bilateral lower extremities, hands, feet, fingers, toes, fingernails, and toenails. All findings within normal limits unless otherwise noted below.  face x 3 (3) Erythematous thin papules/macules with gritty scale.   neck, back Hypopigmented patches   right arm x 2 (2) Stuck-on, waxy, tan-brown papules -- Discussed benign etiology and prognosis.    Assessment & Plan  AK (actinic keratosis) (3) face x 3 Actinic keratoses are precancerous spots that appear secondary to cumulative UV radiation exposure/sun exposure over time. They are chronic with expected duration over 1 year. A portion of actinic keratoses will progress to squamous cell carcinoma of the skin. It is not possible to reliably predict which spots will progress to skin cancer and so treatment is recommended to prevent development of skin cancer.  Recommend daily broad spectrum sunscreen SPF 30+ to sun-exposed areas, reapply every 2 hours as needed.  Recommend staying in the shade or wearing long  sleeves, sun glasses (UVA+UVB protection) and wide brim hats (4-inch brim around the entire circumference of the hat). Call for new or changing lesions.   Destruction of lesion - face x 3 Complexity: simple   Destruction method: cryotherapy   Informed consent: discussed and consent obtained   Timeout:  patient name, date of birth, surgical site, and procedure verified Lesion destroyed using liquid nitrogen: Yes   Region frozen until ice ball extended beyond lesion: Yes   Outcome: patient tolerated procedure well with no complications   Post-procedure details: wound care instructions given    Tinea versicolor neck, back With symptoms of pruritus Tinea versicolor is a chronic recurrent skin rash causing discolored scaly spots most commonly seen on back, chest, and/or shoulders.  It is generally asymptomatic. The rash is due to overgrowth of a common type of yeast present on everyone's skin and it is not contagious.  It tends to flare more in the summer due to increased sweating on trunk.  After rash is treated, the scaliness will resolve, but the discoloration will take longer to return to normal pigmentation. The periodic use of an OTC medicated soap/shampoo with zinc or selenium sulfide can be helpful to prevent yeast overgrowth and recurrence.   Start Ketoconazole shampoo apply to affected skin 3 days a week from the waist up x 2 months   Related Medications ketoconazole (NIZORAL) 2 % shampoo Apply 3 days a week from the waist to the scalp for 2 months  Inflamed seborrheic keratosis (2) right arm x 2  Symptomatic, irritating, patient would  like treated.   Destruction of lesion - right arm x 2 Complexity: simple   Destruction method: cryotherapy   Informed consent: discussed and consent obtained   Timeout:  patient name, date of birth, surgical site, and procedure verified Lesion destroyed using liquid nitrogen: Yes   Region frozen until ice ball extended beyond lesion: Yes    Outcome: patient tolerated procedure well with no complications   Post-procedure details: wound care instructions given    Skin cancer screening  Actinic skin damage  Lentigo  Seborrheic keratosis  Hemangioma of skin  Lentigines - Scattered tan macules - Due to sun exposure - Benign-appearing, observe - Recommend daily broad spectrum sunscreen SPF 30+ to sun-exposed areas, reapply every 2 hours as needed. - Call for any changes  Seborrheic Keratoses - Stuck-on, waxy, tan-brown papules and/or plaques  - Benign-appearing - Discussed benign etiology and prognosis. - Observe - Call for any changes  Melanocytic Nevi - Tan-brown and/or pink-flesh-colored symmetric macules and papules - Benign appearing on exam today - Observation - Call clinic for new or changing moles - Recommend daily use of broad spectrum spf 30+ sunscreen to sun-exposed areas.   Hemangiomas - Red papules - Discussed benign nature - Observe - Call for any changes  Actinic Damage - Chronic condition, secondary to cumulative UV/sun exposure - diffuse scaly erythematous macules with underlying dyspigmentation - Recommend daily broad spectrum sunscreen SPF 30+ to sun-exposed areas, reapply every 2 hours as needed.  - Staying in the shade or wearing long sleeves, sun glasses (UVA+UVB protection) and wide brim hats (4-inch brim around the entire circumference of the hat) are also recommended for sun protection.  - Call for new or changing lesions.  Skin cancer screening performed today.   Return in about 1 year (around 10/05/2022) for TBSE, hx of AKs .  IMarye Round, CMA, am acting as scribe for Sarina Ser, MD .  Documentation: I have reviewed the above documentation for accuracy and completeness, and I agree with the above.  Sarina Ser, MD

## 2021-10-04 NOTE — Patient Instructions (Addendum)
Cryotherapy Aftercare  Wash gently with soap and water everyday.   Apply Vaseline and Band-Aid daily until healed.     Due to recent changes in healthcare laws, you may see results of your pathology and/or laboratory studies on MyChart before the doctors have had a chance to review them. We understand that in some cases there may be results that are confusing or concerning to you. Please understand that not all results are received at the same time and often the doctors may need to interpret multiple results in order to provide you with the best plan of care or course of treatment. Therefore, we ask that you please give us 2 business days to thoroughly review all your results before contacting the office for clarification. Should we see a critical lab result, you will be contacted sooner.   If You Need Anything After Your Visit  If you have any questions or concerns for your doctor, please call our main line at [phone] and press option 4 to reach your doctor's medical assistant. If no one answers, please leave a voicemail as directed and we will return your call as soon as possible. Messages left after 4 pm will be answered the following business day.   You may also send us a message via MyChart. We typically respond to MyChart messages within 1-2 business days.  For prescription refills, please ask your pharmacy to contact our office. Our fax number is [phone].  If you have an urgent issue when the clinic is closed that cannot wait until the next business day, you can page your doctor at the number below.    Please note that while we do our best to be available for urgent issues outside of office hours, we are not available 24/7.   If you have an urgent issue and are unable to reach us, you may choose to seek medical care at your doctor's office, retail clinic, urgent care center, or emergency room.  If you have a medical emergency, please immediately call 911 or go to the  emergency department.  Pager Numbers  - Dr. Kowalski: [phone]  - Dr. Moye: [phone]  - Dr. Stewart: [phone]  In the event of inclement weather, please call our main line at [phone] for an update on the status of any delays or closures.  Dermatology Medication Tips: Please keep the boxes that topical medications come in in order to help keep track of the instructions about where and how to use these. Pharmacies typically print the medication instructions only on the boxes and not directly on the medication tubes.   If your medication is too expensive, please contact our office at [phone] option 4 or send us a message through MyChart.   We are unable to tell what your co-pay for medications will be in advance as this is different depending on your insurance coverage. However, we may be able to find a substitute medication at lower cost or fill out paperwork to get insurance to cover a needed medication.   If a prior authorization is required to get your medication covered by your insurance company, please allow us 1-2 business days to complete this process.  Drug prices often vary depending on where the prescription is filled and some pharmacies may offer cheaper prices.  The website www.goodrx.com contains coupons for medications through different pharmacies. The prices here do not account for what the cost may be with help from insurance (it may be cheaper with your insurance), but the website can   give you the price if you did not use any insurance.  - You can print the associated coupon and take it with your prescription to the pharmacy.  - You may also stop by our office during regular business hours and pick up a GoodRx coupon card.  - If you need your prescription sent electronically to a different pharmacy, notify our office through [place] MyChart or by phone at [phone] option 4.     Si Usted Necesita Algo Despus de Su Visita  Tambin puede  enviarnos un mensaje a travs de MyChart. Por lo general respondemos a los mensajes de MyChart en el transcurso de 1 a 2 das hbiles.  Para renovar recetas, por favor pida a su farmacia que se ponga en contacto con nuestra oficina. Nuestro nmero de fax es el [phone].  Si tiene un asunto urgente cuando la clnica est cerrada y que no puede esperar hasta el siguiente da hbil, puede llamar/localizar a su doctor(a) al nmero que aparece a continuacin.   Por favor, tenga en cuenta que aunque hacemos todo lo posible para estar disponibles para asuntos urgentes fuera del horario de oficina, no estamos disponibles las 24 horas del da, los 7 das de la semana.   Si tiene un problema urgente y no puede comunicarse con nosotros, puede optar por buscar atencin mdica  en el consultorio de su doctor(a), en una clnica privada, en un centro de atencin urgente o en una sala de emergencias.  Si tiene una emergencia mdica, por favor llame inmediatamente al 911 o vaya a la sala de emergencias.  Nmeros de bper  - Dr. Kowalski: [phone]  - Dra. Moye: [phone]  - Dra. Stewart: [phone]  En caso de inclemencias del tiempo, por favor llame a nuestra lnea principal al [phone] para una actualizacin sobre el estado de cualquier retraso o cierre.  Consejos para la medicacin en dermatologa: Por favor, guarde las cajas en las que vienen los medicamentos de uso tpico para ayudarle a seguir las instrucciones sobre dnde y cmo usarlos. Las farmacias generalmente imprimen las instrucciones del medicamento slo en las cajas y no directamente en los tubos del medicamento.   Si su medicamento es muy caro, por favor, pngase en contacto con nuestra oficina llamando al [phone] y presione la opcin 4 o envenos un mensaje a travs de MyChart.   No podemos decirle cul ser su copago por los medicamentos por adelantado ya que esto es diferente dependiendo de la cobertura de su seguro.  Sin embargo, es posible que podamos encontrar un medicamento sustituto a menor costo o llenar un formulario para que el seguro cubra el medicamento que se considera necesario.   Si se requiere una autorizacin previa para que su compaa de seguros cubra su medicamento, por favor permtanos de 1 a 2 das hbiles para completar este proceso.  Los precios de los medicamentos varan con frecuencia dependiendo del lugar de dnde se surte la receta y alguna farmacias pueden ofrecer precios ms baratos.  El sitio web www.goodrx.com tiene cupones para medicamentos de diferentes farmacias. Los precios aqu no tienen en cuenta lo que podra costar con la ayuda del seguro (puede ser ms barato con su seguro), pero el sitio web puede darle el precio si no utiliz ningn seguro.  - Puede imprimir el cupn correspondiente y llevarlo con su receta a la farmacia.  - Tambin puede pasar por nuestra oficina durante el horario de atencin regular y recoger una tarjeta de cupones de GoodRx.  -   Si necesita que su receta se enve electrnicamente a una farmacia diferente, informe a nuestra oficina a travs de MyChart de [place] o por telfono llamando al [phone] y presione la opcin 4.  

## 2021-10-08 ENCOUNTER — Encounter: Payer: Self-pay | Admitting: Dermatology

## 2021-10-09 ENCOUNTER — Other Ambulatory Visit: Payer: Self-pay | Admitting: Family Medicine

## 2021-10-09 DIAGNOSIS — R972 Elevated prostate specific antigen [PSA]: Secondary | ICD-10-CM

## 2021-10-18 ENCOUNTER — Ambulatory Visit: Payer: BC Managed Care – PPO | Admitting: Urology

## 2021-11-01 ENCOUNTER — Encounter: Payer: Self-pay | Admitting: Urology

## 2021-11-01 ENCOUNTER — Ambulatory Visit: Payer: BC Managed Care – PPO | Admitting: Urology

## 2021-11-01 VITALS — BP 126/85 | HR 69 | Ht 73.0 in | Wt 210.0 lb

## 2021-11-01 DIAGNOSIS — R972 Elevated prostate specific antigen [PSA]: Secondary | ICD-10-CM | POA: Diagnosis not present

## 2021-11-01 NOTE — Patient Instructions (Signed)

## 2021-11-01 NOTE — Progress Notes (Signed)
11/01/2021 1:22 PM   Karyl Kinnier 1960/12/05 [id number]  Referring provider: Jerrol Banana., MD No address on file  Chief Complaint  Patient presents with   Elevated PSA    HPI: 61 y.o. male presents for follow-up of an elevated PSA.  Last seen 11/26/2018 PSA was 7.4 and he elected prostate MRI which was performed 01/07/2019.  The exam was markedly degraded secondary to signal artifact generated from bilateral hip prosthesis but no definite high-grade lesions were noted  Current PSA trend: 11/2013           3.4 11/2014           3.4 11/2015           4.2 03/2016             4.3        07/2016             3.6 12/2016           3.2 02/2018             7.1 03/2018             5.4 09/2018             7.4 02/2020             8.2 08/2020  9.9 02/2021  7.3 09/2021  11.6   PMH: No past medical history on file.  Surgical History: Past Surgical History:  Procedure Laterality Date   ANTERIOR CRUCIATE LIGAMENT REPAIR     APPENDECTOMY     KNEE SURGERY     REPLACEMENT TOTAL KNEE     TOTAL HIP ARTHROPLASTY      Home Medications:  Allergies as of 11/01/2021       Reactions   Penicillins         Medication List        Accurate as of November 01, 2021  1:22 PM. If you have any questions, ask your nurse or doctor.          ketoconazole 2 % shampoo Commonly known as: NIZORAL Apply 3 days a week from the waist to the scalp for 2 months   rosuvastatin 10 MG tablet Commonly known as: CRESTOR TAKE 1 TABLET(10 MG) BY MOUTH DAILY        Allergies:  Allergies  Allergen Reactions   Penicillins     Family History: Family History  Problem Relation Age of Onset   Stroke Mother    Arthritis Father    Heart disease Maternal Grandfather     Social History:  reports that he has never smoked. He has never used smokeless tobacco. He reports that he does not drink alcohol and does not use drugs.   Physical Exam: BP 126/85   Pulse 69   Ht '6\' 1"'$  (1.854 m)    Wt 210 lb (95.3 kg)   BMI 27.71 kg/m   Constitutional:  Alert and oriented, No acute distress. HEENT: [place] AT, moist mucus membranes.  Trachea midline, no masses. Cardiovascular: No clubbing, cyanosis, or edema. Respiratory: Normal respiratory effort, no increased work of breathing. GI: Abdomen is soft, nontender, nondistended, no abdominal masses GU: Prostate 70 cc, smooth without nodules Skin: No rashes, bruises or suspicious lesions. Psychiatric: Normal mood and affect.   Assessment & Plan:    1.  Elevated PSA Benign DRE Prostate MRI suboptimal secondary to bilateral hip replacement Most recent PSA 11.6 and recommend schedule TRUS/biopsy prostate The procedure was  discussed including potential risks of bleeding, infection/sepsis He desires to schedule   Abbie Sons, MD  [facility] [address] [place], [place] [address] [phone]

## 2021-11-07 DIAGNOSIS — Z23 Encounter for immunization: Secondary | ICD-10-CM | POA: Diagnosis not present

## 2021-11-09 ENCOUNTER — Ambulatory Visit: Payer: BC Managed Care – PPO | Admitting: Urology

## 2021-11-09 VITALS — BP 122/80 | HR 60 | Ht 72.0 in | Wt 210.0 lb

## 2021-11-09 DIAGNOSIS — C61 Malignant neoplasm of prostate: Secondary | ICD-10-CM | POA: Diagnosis not present

## 2021-11-09 DIAGNOSIS — R972 Elevated prostate specific antigen [PSA]: Secondary | ICD-10-CM | POA: Diagnosis not present

## 2021-11-09 MED ORDER — LEVOFLOXACIN 500 MG PO TABS
500.0000 mg | ORAL_TABLET | Freq: Once | ORAL | Status: AC
  Administered 2021-11-09: 500 mg via ORAL

## 2021-11-09 MED ORDER — GENTAMICIN SULFATE 40 MG/ML IJ SOLN
80.0000 mg | Freq: Once | INTRAMUSCULAR | Status: AC
  Administered 2021-11-09: 80 mg via INTRAMUSCULAR

## 2021-11-09 NOTE — Progress Notes (Unsigned)
Prostate Biopsy Procedure   Informed consent was obtained after discussing risks/benefits of the procedure.  A time out was performed to ensure correct patient identity.  Pre-Procedure: - Last PSA Level: 11.6 09/2021  - Gentamicin given prophylactically - Levaquin 500 mg administered PO -Transrectal Ultrasound performed revealing a 76 gm prostate -No significant hypoechoic or median lobe noted  Procedure: - Prostate block performed using 10 cc 1% lidocaine and biopsies taken from sextant areas, a total of 12 under ultrasound guidance.  Post-Procedure: - Patient tolerated the procedure well - He was counseled to seek immediate medical attention if experiences any severe pain, significant bleeding, or fevers - Return in one week to discuss biopsy results   John Giovanni, MD

## 2021-11-14 LAB — SURGICAL PATHOLOGY

## 2021-11-15 ENCOUNTER — Telehealth: Payer: Self-pay | Admitting: Urology

## 2021-11-15 NOTE — Telephone Encounter (Signed)
I contacted Mr. Ector Laurel to discuss his prostate biopsy report.  He had no postbiopsy problems.  He had seen his report on my chart.  He had 2/12 cores positive for adenocarcinoma the prostate.  The left lateral base showed Gleason 3+3 adenocarcinoma (20%) and left lateral mid Gleason 3+4 adenocarcinoma (30%)  Prostate MRI showed no adenopathy or evidence of extracapsular extension  We discussed his NCCN risk ratification as intermediate favorable  Management options for intermediate favorable risk disease were reviewed.  Surveillance was discussed though would recommend further molecular testing of his pathology if he entertain this option  Radiation modalities of IMRT and brachytherapy were reviewed  RALP was also discussed  He has initially requested further molecular testing as he is contemplating active surveillance.  He was also interested in obtaining an opinion at the [facility] multidisciplinary oncology clinic with urologic oncology and radiation oncology.

## 2021-11-21 ENCOUNTER — Other Ambulatory Visit: Payer: Self-pay | Admitting: Urology

## 2021-11-21 DIAGNOSIS — C61 Malignant neoplasm of prostate: Secondary | ICD-10-CM

## 2021-11-22 ENCOUNTER — Encounter: Payer: Self-pay | Admitting: Urology

## 2021-12-07 ENCOUNTER — Encounter: Payer: Self-pay | Admitting: Urology

## 2021-12-12 DIAGNOSIS — R972 Elevated prostate specific antigen [PSA]: Secondary | ICD-10-CM | POA: Diagnosis not present

## 2021-12-21 DIAGNOSIS — Z96643 Presence of artificial hip joint, bilateral: Secondary | ICD-10-CM | POA: Diagnosis not present

## 2021-12-21 DIAGNOSIS — R5383 Other fatigue: Secondary | ICD-10-CM | POA: Diagnosis not present

## 2021-12-21 DIAGNOSIS — C61 Malignant neoplasm of prostate: Secondary | ICD-10-CM | POA: Diagnosis not present

## 2021-12-22 ENCOUNTER — Telehealth: Payer: Self-pay | Admitting: Urology

## 2021-12-22 NOTE — Telephone Encounter (Signed)
CPS report printed and faxed to [facility]

## 2021-12-22 NOTE — Telephone Encounter (Signed)
[place], [phone], called needs to receive the Molecular Pathology Report for pt, it was left out from the referral from [facility].  Her fax# is [phone]

## 2021-12-29 ENCOUNTER — Encounter: Payer: Self-pay | Admitting: Urology

## 2022-03-06 ENCOUNTER — Ambulatory Visit: Payer: BC Managed Care – PPO | Admitting: Family Medicine

## 2022-04-24 ENCOUNTER — Other Ambulatory Visit: Payer: BC Managed Care – PPO

## 2022-04-24 ENCOUNTER — Other Ambulatory Visit: Payer: Self-pay | Admitting: *Deleted

## 2022-04-24 DIAGNOSIS — R972 Elevated prostate specific antigen [PSA]: Secondary | ICD-10-CM

## 2022-04-24 DIAGNOSIS — C61 Malignant neoplasm of prostate: Secondary | ICD-10-CM

## 2022-04-24 NOTE — Addendum Note (Signed)
Addended by: Chrystie Nose on: 04/24/2022 09:35 AM   Modules accepted: Orders

## 2022-04-25 LAB — PSA: Prostate Specific Ag, Serum: 8.7 ng/mL — ABNORMAL HIGH (ref 0.0–4.0)

## 2022-04-28 ENCOUNTER — Encounter: Payer: Self-pay | Admitting: Urology

## 2022-05-03 DIAGNOSIS — Z0000 Encounter for general adult medical examination without abnormal findings: Secondary | ICD-10-CM | POA: Diagnosis not present

## 2022-05-03 DIAGNOSIS — R972 Elevated prostate specific antigen [PSA]: Secondary | ICD-10-CM | POA: Diagnosis not present

## 2022-05-03 DIAGNOSIS — Z1211 Encounter for screening for malignant neoplasm of colon: Secondary | ICD-10-CM | POA: Diagnosis not present

## 2022-05-03 DIAGNOSIS — E7800 Pure hypercholesterolemia, unspecified: Secondary | ICD-10-CM | POA: Diagnosis not present

## 2022-09-28 ENCOUNTER — Telehealth: Payer: Self-pay | Admitting: Family Medicine

## 2022-09-28 NOTE — Telephone Encounter (Signed)
[facility] pharmacy is requesting prescription refill rosuvastatin (CRESTOR) 10 MG tablet  Please advise

## 2022-09-28 NOTE — Telephone Encounter (Signed)
Patient sees Dr. Sullivan Lone at [facility] in [place]

## 2022-10-17 ENCOUNTER — Encounter: Payer: Self-pay | Admitting: Dermatology

## 2022-10-17 ENCOUNTER — Ambulatory Visit: Payer: BC Managed Care – PPO | Admitting: Dermatology

## 2022-10-17 DIAGNOSIS — Z1283 Encounter for screening for malignant neoplasm of skin: Secondary | ICD-10-CM

## 2022-10-17 DIAGNOSIS — L818 Other specified disorders of pigmentation: Secondary | ICD-10-CM

## 2022-10-17 DIAGNOSIS — L821 Other seborrheic keratosis: Secondary | ICD-10-CM

## 2022-10-17 DIAGNOSIS — L570 Actinic keratosis: Secondary | ICD-10-CM

## 2022-10-17 DIAGNOSIS — Z872 Personal history of diseases of the skin and subcutaneous tissue: Secondary | ICD-10-CM

## 2022-10-17 DIAGNOSIS — B360 Pityriasis versicolor: Secondary | ICD-10-CM

## 2022-10-17 DIAGNOSIS — L578 Other skin changes due to chronic exposure to nonionizing radiation: Secondary | ICD-10-CM

## 2022-10-17 DIAGNOSIS — W908XXA Exposure to other nonionizing radiation, initial encounter: Secondary | ICD-10-CM

## 2022-10-17 DIAGNOSIS — Z7189 Other specified counseling: Secondary | ICD-10-CM

## 2022-10-17 DIAGNOSIS — L299 Pruritus, unspecified: Secondary | ICD-10-CM

## 2022-10-17 DIAGNOSIS — D1801 Hemangioma of skin and subcutaneous tissue: Secondary | ICD-10-CM

## 2022-10-17 DIAGNOSIS — L814 Other melanin hyperpigmentation: Secondary | ICD-10-CM

## 2022-10-17 DIAGNOSIS — Z79899 Other long term (current) drug therapy: Secondary | ICD-10-CM

## 2022-10-17 DIAGNOSIS — D229 Melanocytic nevi, unspecified: Secondary | ICD-10-CM

## 2022-10-17 MED ORDER — KETOCONAZOLE 2 % EX SHAM: MEDICATED_SHAMPOO | CUTANEOUS | 6 refills | Status: DC

## 2022-10-17 NOTE — Patient Instructions (Addendum)

## 2022-10-17 NOTE — Progress Notes (Signed)
Follow-Up Visit   Subjective  Brandon Salazar is a 62 y.o. male who presents for the following: Yearly Skin Cancer Screening and Full Body Skin Exam, hx of precancers.   The patient presents for Total-Body Skin Exam (TBSE) for skin cancer screening and mole check. The patient has spots, moles and lesions to be evaluated, some may be new or changing and the patient may have concern these could be cancer.    The following portions of the chart were reviewed this encounter and updated as appropriate: medications, allergies, medical history  Review of Systems:  No other skin or systemic complaints except as noted in HPI or Assessment and Plan.  Objective  Well appearing patient in no apparent distress; mood and affect are within normal limits.  A full examination was performed including scalp, head, eyes, ears, nose, lips, neck, chest, axillae, abdomen, back, buttocks, bilateral upper extremities, bilateral lower extremities, hands, feet, fingers, toes, fingernails, and toenails. All findings within normal limits unless otherwise noted below.   Relevant physical exam findings are noted in the Assessment and Plan.  forehead, temples, nose x 6 (6) Erythematous thin papules/macules with gritty scale.     Assessment & Plan   SKIN CANCER SCREENING PERFORMED TODAY.  ACTINIC DAMAGE - Chronic condition, secondary to cumulative UV/sun exposure - diffuse scaly erythematous macules with underlying dyspigmentation - Recommend daily broad spectrum sunscreen SPF 30+ to sun-exposed areas, reapply every 2 hours as needed.  - Staying in the shade or wearing long sleeves, sun glasses (UVA+UVB protection) and wide brim hats (4-inch brim around the entire circumference of the hat) are also recommended for sun protection.  - Call for new or changing lesions.  LENTIGINES, SEBORRHEIC KERATOSES, HEMANGIOMAS - Benign normal skin lesions - Benign-appearing - Call for any changes  MELANOCYTIC NEVI -  Tan-brown and/or pink-flesh-colored symmetric macules and papules - Benign appearing on exam today - Observation - Call clinic for new or changing moles - Recommend daily use of broad spectrum spf 30+ sunscreen to sun-exposed areas.   Idiopathic guttate hypomelanosis Body and arms Benign-appearing.  Observation.  Call clinic for new or changing lesions.  Recommend daily use of broad spectrum spf 30+ sunscreen to sun-exposed areas.   AK (actinic keratosis) (6) forehead, temples, nose x 6  Actinic keratoses are precancerous spots that appear secondary to cumulative UV radiation exposure/sun exposure over time. They are chronic with expected duration over 1 year. A portion of actinic keratoses will progress to squamous cell carcinoma of the skin. It is not possible to reliably predict which spots will progress to skin cancer and so treatment is recommended to prevent development of skin cancer.  Recommend daily broad spectrum sunscreen SPF 30+ to sun-exposed areas, reapply every 2 hours as needed.  Recommend staying in the shade or wearing long sleeves, sun glasses (UVA+UVB protection) and wide brim hats (4-inch brim around the entire circumference of the hat). Call for new or changing lesions.   Destruction of lesion - forehead, temples, nose x 6 (6) Complexity: simple   Destruction method: cryotherapy   Informed consent: discussed and consent obtained   Timeout:  patient name, date of birth, surgical site, and procedure verified Lesion destroyed using liquid nitrogen: Yes   Region frozen until ice ball extended beyond lesion: Yes   Outcome: patient tolerated procedure well with no complications   Post-procedure details: wound care instructions given    Tinea versicolor neck, back With symptoms of pruritus Tinea versicolor is a chronic recurrent  skin rash causing discolored scaly spots most commonly seen on back, chest, and/or shoulders.  It is generally asymptomatic. The rash is due to  overgrowth of a common type of yeast present on everyone's skin and it is not contagious.  It tends to flare more in the summer due to increased sweating on trunk.  After rash is treated, the scaliness will resolve, but the discoloration will take longer to return to normal pigmentation. The periodic use of an OTC medicated soap/shampoo with zinc or selenium sulfide can be helpful to prevent yeast overgrowth and recurrence.    Restart Ketoconazole shampoo apply to affected skin 3 days a week from the waist up x 2 months  Tinea versicolor  Related Medications ketoconazole (NIZORAL) 2 % shampoo Apply 3 days a week from the waist to the scalp for 3 months  Return in about 1 year (around 10/17/2023) for TBSE, hx of Aks .  IAngelique Holm, CMA, am acting as scribe for Armida Sans, MD .   Documentation: I have reviewed the above documentation for accuracy and completeness, and I agree with the above.  Armida Sans, MD

## 2022-10-30 ENCOUNTER — Other Ambulatory Visit: Payer: Self-pay | Admitting: *Deleted

## 2022-10-30 DIAGNOSIS — C61 Malignant neoplasm of prostate: Secondary | ICD-10-CM

## 2022-10-31 ENCOUNTER — Other Ambulatory Visit: Payer: BC Managed Care – PPO

## 2022-10-31 DIAGNOSIS — C61 Malignant neoplasm of prostate: Secondary | ICD-10-CM | POA: Diagnosis not present

## 2022-11-01 ENCOUNTER — Ambulatory Visit: Payer: BC Managed Care – PPO | Admitting: Urology

## 2022-11-01 ENCOUNTER — Encounter: Payer: Self-pay | Admitting: Urology

## 2022-11-01 VITALS — BP 124/78 | HR 68 | Ht 74.0 in | Wt 210.0 lb

## 2022-11-01 DIAGNOSIS — C61 Malignant neoplasm of prostate: Secondary | ICD-10-CM | POA: Diagnosis not present

## 2022-11-01 LAB — PSA: Prostate Specific Ag, Serum: 8.2 ng/mL — ABNORMAL HIGH (ref 0.0–4.0)

## 2022-11-01 NOTE — Progress Notes (Signed)
I,[name],acting as a scribe for Brandon Altes, MD.,have documented all relevant documentation on the behalf of Brandon Altes, MD,as directed by  Brandon Altes, MD while in the presence of Brandon Altes, MD.  11/01/2022 2:15 PM   Clydene Fake 09-13-1960 [id number]  Referring provider: Ronnald Ramp, MD [address] [place],  [place] [address]  Chief Complaint  Patient presents with   Elevated PSA   Urologic History:  1. Prostate cancer Biopsy 11/09/21, PSA 11.6; 76 gram prostate Path: 2/12 cores positive with LLB Gleason 3+3 (20%) and LLM Gleason 3+4 (30%) Genomic prostate score 31 Second opinion at [facility] and he elected active surveillance   HPI: Brandon Salazar is a 62 y.o.  male with a personal history of prostate cancer presents today for follow up.  He presents today with his spouse.  No problems since his last visit.  No bothersome LUTS. PSA 04/24/2022 8.7 and PSA 10/31/2022 8.2. He was scheduled for confirmatory biopsy at [facility], however, states this was canceled after his PSA dropped from 11.6 to 8.7   PMH: No past medical history on file.  Surgical History: Past Surgical History:  Procedure Laterality Date   ANTERIOR CRUCIATE LIGAMENT REPAIR     APPENDECTOMY     KNEE SURGERY     REPLACEMENT TOTAL KNEE     TOTAL HIP ARTHROPLASTY      Home Medications:  Allergies as of 11/01/2022       Reactions   Penicillins         Medication List        Accurate as of November 01, 2022  2:15 PM. If you have any questions, ask your nurse or doctor.          ketoconazole 2 % shampoo Commonly known as: NIZORAL Apply 3 days a week from the waist to the scalp for 3 months   rosuvastatin 10 MG tablet Commonly known as: CRESTOR TAKE 1 TABLET(10 MG) BY MOUTH DAILY        Allergies:  Allergies  Allergen Reactions   Penicillins     Family History: Family History  Problem Relation Age of Onset   Stroke Mother     Arthritis Father    Heart disease Maternal Grandfather     Social History:  reports that he has never smoked. He has never used smokeless tobacco. He reports that he does not drink alcohol and does not use drugs.   Physical Exam: BP 124/78   Pulse 68   Ht 6\' 2"  (1.88 m)   Wt 210 lb (95.3 kg)   BMI 26.96 kg/m   Constitutional:  Alert, No acute distress. HEENT: [place] AT Respiratory: Normal respiratory effort, no increased work of breathing. GU: Prostate 70 grams; smooth without nodules. Psychiatric: Normal mood and affect.   Assessment & Plan:    1. T1c favorable intermediate risk prostate cancer He has elected active surveillance Stable PSA and benign DRE We discussed active surveillance and r recommendations of confirmatory biopsy within two years of the original diagnosis. Since his PSA has decreased and is stable, he would like to hold off on a biopsy until year two and will tentatively schedule next year If his PSA, April 2025, increases he may elect to have the biopsy earlier.   I have reviewed the above documentation for accuracy and completeness, and I agree with the above.   Brandon Altes, MD  [facility] [address] [place], [place] [address] [phone]

## 2022-11-01 NOTE — Patient Instructions (Signed)
Prostate Biopsy Instructions  Stop all aspirin or blood thinners (aspirin, plavix, coumadin, warfarin, motrin, ibuprofen, advil, aleve, naproxen, naprosyn) for 7 days prior to the procedure.  If you have any questions about stopping these medications, please contact your primary care physician or cardiologist.  Having a light meal prior to the procedure is recommended.  If you are diabetic or have low blood sugar please bring a small snack or glucose tablet.  A Fleets enema is needed to be purchased over the counter at a local pharmacy and used 2 hours before you scheduled appointment.  This can be purchased over the counter at any pharmacy.  Antibiotics will be administered in the clinic at the time of the procedure unless otherwise specified.    Please bring someone with you to the procedure to drive you home.  A follow up appointment has been scheduled for you to receive the results of the biopsy.  If you have any questions or concerns, please feel free to call the office at [phone] or send a Mychart message.    Thank you, Staff at [facility]

## 2022-11-02 ENCOUNTER — Ambulatory Visit: Payer: BC Managed Care – PPO | Admitting: Urology

## 2022-12-04 DIAGNOSIS — Z860101 Personal history of adenomatous and serrated colon polyps: Secondary | ICD-10-CM | POA: Diagnosis not present

## 2022-12-04 DIAGNOSIS — Z01818 Encounter for other preprocedural examination: Secondary | ICD-10-CM | POA: Diagnosis not present

## 2023-02-20 ENCOUNTER — Ambulatory Visit: Payer: BC Managed Care – PPO

## 2023-02-20 DIAGNOSIS — K5730 Diverticulosis of large intestine without perforation or abscess without bleeding: Secondary | ICD-10-CM | POA: Diagnosis not present

## 2023-02-20 DIAGNOSIS — Z1211 Encounter for screening for malignant neoplasm of colon: Secondary | ICD-10-CM | POA: Diagnosis present

## 2023-02-20 DIAGNOSIS — Z860101 Personal history of adenomatous and serrated colon polyps: Secondary | ICD-10-CM | POA: Diagnosis not present

## 2023-02-20 DIAGNOSIS — K640 First degree hemorrhoids: Secondary | ICD-10-CM | POA: Diagnosis not present

## 2023-02-20 DIAGNOSIS — D123 Benign neoplasm of transverse colon: Secondary | ICD-10-CM | POA: Diagnosis not present

## 2023-10-17 ENCOUNTER — Ambulatory Visit: Payer: BC Managed Care – PPO | Admitting: Dermatology

## 2023-10-23 ENCOUNTER — Other Ambulatory Visit: Payer: Self-pay

## 2023-10-23 DIAGNOSIS — C61 Malignant neoplasm of prostate: Secondary | ICD-10-CM

## 2023-10-24 ENCOUNTER — Telehealth: Payer: Self-pay | Admitting: *Deleted

## 2023-10-24 LAB — PSA: Prostate Specific Ag, Serum: 8.6 ng/mL — ABNORMAL HIGH (ref 0.0–4.0)

## 2023-10-24 NOTE — Telephone Encounter (Signed)
 Does this patient suppose to get a prostate biopsy ?

## 2023-10-30 ENCOUNTER — Encounter: Payer: Self-pay | Admitting: Urology

## 2023-10-30 ENCOUNTER — Ambulatory Visit: Payer: Self-pay | Admitting: Urology

## 2023-10-30 VITALS — BP 128/80 | HR 72 | Ht 73.0 in | Wt 208.0 lb

## 2023-10-30 DIAGNOSIS — C61 Malignant neoplasm of prostate: Secondary | ICD-10-CM

## 2023-10-30 NOTE — Progress Notes (Unsigned)
   10/30/2023 2:16 PM   Brandon Salazar 02-02-1960 [id number]  Referring provider: Bertrum Charlie LITTIE, MD [address] [place],  [place] [address]  Chief Complaint  Patient presents with   Results   Urologic History:   1. Prostate cancer Biopsy 11/09/21, PSA 11.6; 76 gram prostate Path: 2/12 cores positive with LLB Gleason 3+3 (20%) and LLM Gleason 3+4 (30%) Genomic prostate score 31 Second opinion at [facility] and he elected active surveillance  HPI: Brandon Salazar is a 63 y.o. male presents for prostate cancer follow-up  Had contacted the office to schedule confirmatory biopsy.  I had recommended a follow-up prostate MRI prior to biopsy however when asked about the appointment by staff had not had a chance to look at his chart.  Prior prostate MRI had significant artifact secondary to bilateral hip prosthesis Presently no complaints PSA 10/23/2023 stable 8.6  PSA trend    Prostate Specific Ag, Serum  Latest Ref Rng 0.0 - 4.0 ng/mL  02/29/2020 8.2  09/20/2020 9.9  03/02/2021 7.3  10/02/2021 11.6  12/21/2021 8.58 ([facility])  04/24/2022 8.7  10/31/2022 8.2  05/07/2023 8.73  10/23/2023 8.6    PMH: No past medical history on file.  Surgical History: Past Surgical History:  Procedure Laterality Date   ANTERIOR CRUCIATE LIGAMENT REPAIR     APPENDECTOMY     KNEE SURGERY     REPLACEMENT TOTAL KNEE     TOTAL HIP ARTHROPLASTY      Home Medications:  Allergies as of 10/30/2023       Reactions   Penicillins         Medication List        Accurate as of October 30, 2023  2:16 PM. If you have any questions, ask your nurse or doctor.          ketoconazole  2 % shampoo Commonly known as: NIZORAL  Apply 3 days a week from the waist to the scalp for 3 months   rosuvastatin  10 MG tablet Commonly known as: CRESTOR  TAKE 1 TABLET(10 MG) BY MOUTH DAILY        Allergies:  Allergies  Allergen Reactions   Penicillins     Family History: Family History  Problem Relation Age of  Onset   Stroke Mother    Arthritis Father    Heart disease Maternal Grandfather     Social History:  reports that he has never smoked. He has never used smokeless tobacco. He reports that he does not drink alcohol and does not use drugs.   Physical Exam: BP 128/80   Pulse 72   Ht 6' 1 (1.854 m)   Wt 208 lb (94.3 kg)   BMI 27.44 kg/m   Constitutional:  Alert, No acute distress. HEENT: [place] AT Respiratory: Normal respiratory effort, no increased work of breathing. Psychiatric: Normal mood and affect.   Assessment & Plan:    1.  T1c prostate cancer Intermediate risk Elected active surveillance Schedule confirmatory biopsy.  He tolerated his previous biopsy without problems and declined scheduling in same-day surgery under sedation   Glendia JAYSON Barba, MD  [facility] [address] [place], [place] [address] [phone]

## 2023-10-31 ENCOUNTER — Encounter: Payer: Self-pay | Admitting: Urology

## 2023-11-15 ENCOUNTER — Encounter: Payer: Self-pay | Admitting: Urology

## 2023-11-15 ENCOUNTER — Ambulatory Visit (INDEPENDENT_AMBULATORY_CARE_PROVIDER_SITE_OTHER): Admitting: Urology

## 2023-11-15 VITALS — BP 134/90 | HR 97 | Ht 73.0 in | Wt 207.0 lb

## 2023-11-15 DIAGNOSIS — C61 Malignant neoplasm of prostate: Secondary | ICD-10-CM

## 2023-11-15 DIAGNOSIS — R972 Elevated prostate specific antigen [PSA]: Secondary | ICD-10-CM

## 2023-11-15 DIAGNOSIS — Z2989 Encounter for other specified prophylactic measures: Secondary | ICD-10-CM | POA: Diagnosis not present

## 2023-11-15 MED ORDER — GENTAMICIN SULFATE 40 MG/ML IJ SOLN
80.0000 mg | Freq: Once | INTRAMUSCULAR | Status: AC
  Administered 2023-11-15: 80 mg via INTRAMUSCULAR

## 2023-11-15 MED ORDER — LEVOFLOXACIN 500 MG PO TABS
500.0000 mg | ORAL_TABLET | Freq: Once | ORAL | Status: AC
  Administered 2023-11-15: 500 mg via ORAL

## 2023-11-15 NOTE — Progress Notes (Signed)
   Prostate Biopsy Procedure   Informed consent was obtained after discussing risks/benefits of the procedure.  A time out was performed to ensure correct patient identity.  Pre-Procedure: - Intermediate risk prostate cancer on active surveillance - Presents for confirmatory biopsy - Last PSA Level: 8.6 (10/23/2023)  - Gentamicin  given prophylactically - Levaquin  500 mg administered PO -Transrectal Ultrasound performed revealing a 101 gm prostate -No significant hypoechoic or median lobe noted  Procedure: - Prostate block performed using 10 cc 1% lidocaine and biopsies taken from sextant areas, a total of 16 under ultrasound guidance (3 cores obtained at LLB, LLM-prior positive biopsy sites)  Post-Procedure: - Patient tolerated the procedure well - He was counseled to seek immediate medical attention if experiences any severe pain, significant bleeding, or fevers - Will call with biopsy report   Glendia Barba, MD

## 2023-11-25 LAB — PROSTATE CORE NEEDLE BIOPSY

## 2023-11-28 ENCOUNTER — Ambulatory Visit: Payer: Self-pay | Admitting: Urology

## 2023-12-02 ENCOUNTER — Encounter: Admitting: Urology

## 2024-01-28 ENCOUNTER — Ambulatory Visit: Admitting: Dermatology

## 2024-01-28 ENCOUNTER — Encounter: Payer: Self-pay | Admitting: Dermatology

## 2024-01-28 DIAGNOSIS — L814 Other melanin hyperpigmentation: Secondary | ICD-10-CM

## 2024-01-28 DIAGNOSIS — L578 Other skin changes due to chronic exposure to nonionizing radiation: Secondary | ICD-10-CM

## 2024-01-28 DIAGNOSIS — L570 Actinic keratosis: Secondary | ICD-10-CM | POA: Diagnosis not present

## 2024-01-28 DIAGNOSIS — L503 Dermatographic urticaria: Secondary | ICD-10-CM

## 2024-01-28 DIAGNOSIS — L2089 Other atopic dermatitis: Secondary | ICD-10-CM

## 2024-01-28 DIAGNOSIS — L989 Disorder of the skin and subcutaneous tissue, unspecified: Secondary | ICD-10-CM | POA: Diagnosis not present

## 2024-01-28 DIAGNOSIS — W908XXA Exposure to other nonionizing radiation, initial encounter: Secondary | ICD-10-CM | POA: Diagnosis not present

## 2024-01-28 DIAGNOSIS — B360 Pityriasis versicolor: Secondary | ICD-10-CM

## 2024-01-28 DIAGNOSIS — Z7189 Other specified counseling: Secondary | ICD-10-CM

## 2024-01-28 DIAGNOSIS — Z1283 Encounter for screening for malignant neoplasm of skin: Secondary | ICD-10-CM

## 2024-01-28 DIAGNOSIS — Z79899 Other long term (current) drug therapy: Secondary | ICD-10-CM

## 2024-01-28 DIAGNOSIS — D229 Melanocytic nevi, unspecified: Secondary | ICD-10-CM

## 2024-01-28 DIAGNOSIS — D1801 Hemangioma of skin and subcutaneous tissue: Secondary | ICD-10-CM

## 2024-01-28 DIAGNOSIS — L821 Other seborrheic keratosis: Secondary | ICD-10-CM | POA: Diagnosis not present

## 2024-01-28 MED ORDER — MOMETASONE FUROATE 0.1 % EX CREA: 1.0000 | TOPICAL_CREAM | CUTANEOUS | 2 refills | Status: AC

## 2024-01-28 MED ORDER — KETOCONAZOLE 2 % EX SHAM: MEDICATED_SHAMPOO | CUTANEOUS | 6 refills | Status: AC

## 2024-01-28 NOTE — Patient Instructions (Addendum)

## 2024-01-28 NOTE — Progress Notes (Signed)
 "  Follow-Up Visit   Subjective  Brandon Salazar is a 64 y.o. male who presents for the following: Skin Cancer Screening and Full Body Skin Exam hx of Aks, itching neck comes and goes  The patient presents for Total-Body Skin Exam (TBSE) for skin cancer screening and mole check. The patient has spots, moles and lesions to be evaluated, some may be new or changing and the patient may have concern these could be cancer.  The following portions of the chart were reviewed this encounter and updated as appropriate: medications, allergies, medical history  Review of Systems:  No other skin or systemic complaints except as noted in HPI or Assessment and Plan.  Objective  Well appearing patient in no apparent distress; mood and affect are within normal limits.  A full examination was performed including scalp, head, eyes, ears, nose, lips, neck, chest, axillae, abdomen, back, buttocks, bilateral upper extremities, bilateral lower extremities, hands, feet, fingers, toes, fingernails, and toenails. All findings within normal limits unless otherwise noted below.   Relevant physical exam findings are noted in the Assessment and Plan.  face x 3 (3) Pink scaly macules  Assessment & Plan   SKIN CANCER SCREENING PERFORMED TODAY.  ACTINIC DAMAGE - Chronic condition, secondary to cumulative UV/sun exposure - diffuse scaly erythematous macules with underlying dyspigmentation - Recommend daily broad spectrum sunscreen SPF 30+ to sun-exposed areas, reapply every 2 hours as needed.  - Staying in the shade or wearing long sleeves, sun glasses (UVA+UVB protection) and wide brim hats (4-inch brim around the entire circumference of the hat) are also recommended for sun protection.  - Call for new or changing lesions.  LENTIGINES, SEBORRHEIC KERATOSES, HEMANGIOMAS - Benign normal skin lesions - Benign-appearing - Call for any changes  MELANOCYTIC NEVI - Tan-brown and/or pink-flesh-colored symmetric macules  and papules - Benign appearing on exam today - Observation - Call clinic for new or changing moles - Recommend daily use of broad spectrum spf 30+ sunscreen to sun-exposed areas.   Atopic DERMATITIS vs OTHER neck Exam: Mild pinkness R and L post neck Treatment Plan: Start Mometasone  cr qd up to 5 days/wk prn flares  Topical steroids (such as triamcinolone, fluocinolone, fluocinonide, mometasone , clobetasol, halobetasol, betamethasone, hydrocortisone) can cause thinning and lightening of the skin if they are used for too long in the same area. Your physician has selected the right strength medicine for your problem and area affected on the body. Please use your medication only as directed by your physician to prevent side effects.   DERMATOGRAPHISM L arm Exam: dermatographism L arm Treatment Plan: Discussed taking antihistamines May start Mometasone  cr qd prn flares/itching  Tinea Versicolor Back  Chronic and persistent condition with duration or expected duration over one year. Condition is symptomatic/ bothersome to patient. Not currently at goal. Tinea versicolor is a chronic recurrent skin rash causing discolored scaly spots most commonly seen on back, chest, and/or shoulders.  It is generally asymptomatic. The rash is due to overgrowth of a common type of yeast present on everyone's skin and it is not contagious.  It tends to flare more in the summer due to increased sweating on trunk.  After rash is treated, the scaliness will resolve, but the discoloration will take longer to return to normal pigmentation. The periodic use of an OTC medicated soap/shampoo with zinc or selenium sulfide can be helpful to prevent yeast overgrowth and recurrence. Treatment Plan: Restart Ketoconazole  2% shampoo wash back 3d/wk, let sit 5 minutes and rinse off  AK (ACTINIC KERATOSIS) (3) face x 3 (3) Actinic keratoses are precancerous spots that appear secondary to cumulative UV radiation  exposure/sun exposure over time. They are chronic with expected duration over 1 year. A portion of actinic keratoses will progress to squamous cell carcinoma of the skin. It is not possible to reliably predict which spots will progress to skin cancer and so treatment is recommended to prevent development of skin cancer.  Recommend daily broad spectrum sunscreen SPF 30+ to sun-exposed areas, reapply every 2 hours as needed.  Recommend staying in the shade or wearing long sleeves, sun glasses (UVA+UVB protection) and wide brim hats (4-inch brim around the entire circumference of the hat). Call for new or changing lesions. - Destruction of lesion - face x 3 (3) Complexity: simple   Destruction method: cryotherapy   Informed consent: discussed and consent obtained   Timeout:  patient name, date of birth, surgical site, and procedure verified Lesion destroyed using liquid nitrogen: Yes   Region frozen until ice ball extended beyond lesion: Yes   Outcome: patient tolerated procedure well with no complications   Post-procedure details: wound care instructions given    TINEA VERSICOLOR   This Visit - ketoconazole  (NIZORAL ) 2 % shampoo - Apply 3 days a week from the waist to the scalp let sit 5 minutes and rinse off for 3 months Return in about 1 year (around 01/27/2025) for TBSE, Hx of AKs.  I, Grayce Saunas, RMA, am acting as scribe for Alm Rhyme, MD .   Documentation: I have reviewed the above documentation for accuracy and completeness, and I agree with the above.  Alm Rhyme, MD    "

## 2025-01-27 ENCOUNTER — Ambulatory Visit: Admitting: Dermatology
